# Patient Record
Sex: Male | Born: 1955 | Race: White | Marital: Married | Smoking: Current every day smoker
Health system: Northeastern US, Academic
[De-identification: ages and names within clinical notes are randomized; demographics above are authoritative.]

## PROBLEM LIST (undated history)

## (undated) DIAGNOSIS — I10 Essential (primary) hypertension: Secondary | ICD-10-CM

## (undated) DIAGNOSIS — F419 Anxiety disorder, unspecified: Secondary | ICD-10-CM

## (undated) DIAGNOSIS — K219 Gastro-esophageal reflux disease without esophagitis: Secondary | ICD-10-CM

## (undated) HISTORY — DX: Essential (primary) hypertension: I10

## (undated) HISTORY — DX: Anxiety disorder, unspecified: F41.9

---

## 2007-04-03 DIAGNOSIS — K219 Gastro-esophageal reflux disease without esophagitis: Secondary | ICD-10-CM | POA: Insufficient documentation

## 2008-11-24 ENCOUNTER — Encounter: Payer: Self-pay | Admitting: Cardiology

## 2008-11-25 ENCOUNTER — Other Ambulatory Visit: Payer: Self-pay | Admitting: Cardiology

## 2008-12-24 DIAGNOSIS — J988 Other specified respiratory disorders: Secondary | ICD-10-CM | POA: Insufficient documentation

## 2009-03-15 ENCOUNTER — Ambulatory Visit: Payer: Self-pay | Admitting: Primary Care

## 2009-04-14 ENCOUNTER — Ambulatory Visit: Payer: Self-pay | Admitting: Orthopedic Surgery

## 2009-05-06 NOTE — Progress Notes (Signed)
Seen in a new patient visit with complaints of left knee pain. He is a  53 year old gentleman who injured his knee in late September 2010. He was  walking when he slipped. A piece of aluminum struck him in the area of the  medial tibial plateau. He recalls having had a superficial abrasion. He did  have ecchymosis and swelling. He is only partially improved. He does have  some increased swelling in that area when he is on his feet a lot. 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 He has not tried anything in particular for the  symptoms. He has no mechanical symptoms regarding the knee itself. He does  not have pain in the knee, but only in the area where the piece of aluminum  struck him. Currently he reports that his pain is a 0 to 1. He does not  have any redness or warmth. He has no history of previous injury on that  side.    MEDICATIONS:He is not on any regular medications for these symptoms.    PAST MEDICAL HISTORY:He reports that he is generally healthy. He reports no  regular medical problems and does not take any regular medications.    ALLERGIES:He is allergic to cephalexin which causes a rash.    PAST SURGICAL HISTORY:For surgeries, he has had ORIF of a right calcaneus  fracture and right hand fracture.    SOCIAL HISTORY:The patient's smokes less than 1/2 pack per day x20 years.  He drinks 12 to 24 drinks per week.    FAMILY HISTORY:Family history and review of systems, denied.    The new patient questionnaire is included in the electronic record. Please  refer to this for additional details.    PHYSICAL EXAMINATION:   Exam shows a healthy appearing pleasant gentleman  who did not appear to be in acute distress. The left lower extremity is  inspected. There is slight soft tissue swelling in the area of the medial  tibial plateau. There are no areas of redness. There are no abrasions.  There is no warmth or induration. He was only tender  directly over that  area. The remainder of the knee and extremity is nontender to touch. He has  full range of motion to the knee. His knee is stable with stressing.  Stressing the MCL did not produce any laxity or discomfort. McMurray's is  negative for pain or a snap. He has good alignment of the limp and good  quadriceps strength is present. Close inspection of the area of swelling  reveals that there is a superficial vein directly in the central portion of  this which was only slightly tender to touch.    RADIOGRAPHS:  Plain radiographs were made and show minimal patellofemoral  DJD but there is no evidence of fracture or other pathology in that area.    IMPRESSION:  The patient sustained a hematoma from his injury in late  September. It is possible that he may have a small area of superficial  phlebitis overlying this.    RECOMMENDATION:  Heat and NSAIDs. He does not appear to have any pathology  within the knee itself. He is advised that these can sometimes take quite  some time to resolve, particularly when we are dealing with bone contusion  and hematoma. After discussing the problem in detail with him he had no  additional questions. He will follow up with me in a few weeks if this  does  not seem to be resolving.      Dictated by:  Edman Circle, PA  Electronically Reviewed and Signed by  Edman Circle, PA 04/18/2009 08:00  Co-signature.    Electronically Signed and Finalized  by  Wilder Glade, MD 05/06/2009 11:25  ___________________________________________  Wilder Glade, MD      DD:   04/14/2009  DT:   04/14/2009  1:42 P  AVW/UJ#[id number]  [id number]    cc:   [name], DO

## 2009-05-14 HISTORY — PX: APPENDECTOMY: SHX54

## 2009-07-22 ENCOUNTER — Encounter: Payer: Self-pay | Admitting: Gastroenterology

## 2009-10-04 ENCOUNTER — Observation Stay
Admission: EM | Admit: 2009-10-04 | Disposition: A | Discharge status: Home / Self Care | Payer: Self-pay | Source: Ambulatory Visit | Attending: Emergency Medicine | Admitting: Emergency Medicine

## 2009-10-04 ENCOUNTER — Encounter: Payer: Self-pay | Admitting: Emergency Medicine

## 2009-10-04 ENCOUNTER — Other Ambulatory Visit: Payer: Self-pay | Admitting: Gastroenterology

## 2009-10-04 HISTORY — DX: Gastro-esophageal reflux disease without esophagitis: K21.9

## 2009-10-04 LAB — CBC AND DIFFERENTIAL
Baso # K/uL: 0.1 THOU/uL (ref 0.0–0.1)
Basophil %: 0.7 % (ref 0.2–1.2)
Eos # K/uL: 0.3 THOU/uL (ref 0.0–0.5)
Eosinophil %: 4.9 % (ref 0.8–7.0)
Hematocrit: 44 % (ref 40–51)
Hemoglobin: 15.4 g/dL (ref 13.7–17.5)
Lymph # K/uL: 2 THOU/uL (ref 1.3–3.6)
Lymphocyte %: 29.4 % (ref 21.8–53.1)
MCV: 90 fL (ref 79–92)
Mono # K/uL: 0.7 THOU/uL (ref 0.3–0.8)
Monocyte %: 10.4 % (ref 5.3–12.2)
Neut # K/uL: 3.7 THOU/uL (ref 1.8–5.4)
Platelets: 216 THOU/uL (ref 150–330)
RBC: 4.9 MIL/uL (ref 4.6–6.1)
RDW: 13.3 % (ref 11.6–14.4)
Seg Neut %: 54.6 % (ref 34.0–67.9)
WBC: 6.7 THOU/uL (ref 4.2–9.1)

## 2009-10-04 LAB — TSH: TSH: 0.65 u[IU]/mL (ref 0.27–4.20)

## 2009-10-04 LAB — APTT: aPTT: 27 s (ref 22.3–35.3)

## 2009-10-04 LAB — PLASMA PROF 7 (ED ONLY)
Anion Gap,PL: 10 (ref 7–16)
CO2,Plasma: 25 mmol/L (ref 20–28)
Chloride,Plasma: 103 mmol/L (ref 96–108)
Creatinine: 0.84 mg/dL (ref 0.67–1.17)
GFR,Black: >59 *
GFR,Caucasian: >59 *
Glucose,Plasma: 104 mg/dL (ref 74–106)
Potassium,Plasma: 4 mmol/L (ref 3.4–4.7)
Sodium,Plasma: 138 mmol/L (ref 132–146)
UN,Plasma: 12 mg/dL (ref 6–20)

## 2009-10-04 LAB — TROPONIN T
Troponin T: <0.01 ng/mL (ref 0.00–0.02)
Troponin T: <0.01 ng/mL (ref 0.00–0.02)

## 2009-10-04 LAB — CALCIUM: Calcium: 9.1 mg/dL (ref 8.6–10.2)

## 2009-10-04 LAB — PROTIME-INR
INR: 1 (ref 0.9–1.1)
Protime: 13.5 s (ref 11.9–14.7)

## 2009-10-04 LAB — T4, FREE: Free T4: 1.1 ng/dL (ref 0.9–1.7)

## 2009-10-04 LAB — PHOSPHORUS: Phosphorus: 2.9 mg/dL (ref 2.7–4.5)

## 2009-10-04 LAB — MAGNESIUM: Magnesium: 1.7 meq/L (ref 1.3–2.1)

## 2009-10-04 MED ORDER — FAMOTIDINE 20 MG PO TABS *I*
20.0000 mg | ORAL_TABLET | Freq: Once | ORAL | Status: AC
Start: 2009-10-04 — End: 2009-10-04
  Administered 2009-10-04: 20 mg via ORAL
  Filled 2009-10-04: qty 1

## 2009-10-04 MED ORDER — ASPIRIN 81 MG PO CHEW *I*
324.0000 mg | CHEWABLE_TABLET | Freq: Once | ORAL | Status: AC
Start: 2009-10-04 — End: 2009-10-04
  Administered 2009-10-04: 324 mg via ORAL
  Filled 2009-10-04: qty 4

## 2009-10-04 MED ORDER — FAMOTIDINE 20 MG PO TABS *I*
20.0000 mg | ORAL_TABLET | Freq: Two times a day (BID) | ORAL | Status: DC
Start: 2009-10-05 — End: 2009-10-05
  Administered 2009-10-05: 20 mg via ORAL
  Filled 2009-10-04: qty 1

## 2009-10-04 MED ORDER — PANTOPRAZOLE SODIUM 40 MG PO TBEC *I*
40.0000 mg | DELAYED_RELEASE_TABLET | Freq: Every morning | ORAL | Status: DC
Start: 2009-10-05 — End: 2009-10-05
  Administered 2009-10-05: 40 mg via ORAL
  Filled 2009-10-04: qty 1

## 2009-10-04 MED ORDER — ALUM & MAG HYDROXIDE-SIMETH 200-200-20 MG/5ML PO SUSP *I*
30.0000 mL | Freq: Once | ORAL | Status: AC
Start: 2009-10-04 — End: 2009-10-04
  Administered 2009-10-04: 30 mL via ORAL

## 2009-10-04 MED ORDER — SODIUM CHLORIDE 0.9 % IV BOLUS *I*
1000.0000 mL | Status: AC
Start: 2009-10-04 — End: 2009-10-04
  Administered 2009-10-04: 1000 mL via INTRAVENOUS

## 2009-10-04 MED ORDER — NITROGLYCERIN 0.4 MG SL SUBL *I*
0.4000 mg | SUBLINGUAL_TABLET | SUBLINGUAL | Status: AC | PRN
Start: 2009-10-04 — End: 2009-10-04
  Administered 2009-10-04: 0.4 mg via SUBLINGUAL
  Filled 2009-10-04: qty 1

## 2009-10-04 MED ORDER — ACETAMINOPHEN 325 MG PO TABS *I*
650.0000 mg | ORAL_TABLET | ORAL | Status: DC | PRN
Start: 2009-10-04 — End: 2009-10-05
  Filled 2009-10-04: qty 1

## 2009-10-04 NOTE — ED Notes (Signed)
Patient denies pain and is resting comfortably. After walking to br in ed obs unit. Will monitor, observe,. Treat.repeat  ekg trop as per orders

## 2009-10-04 NOTE — ED Notes (Signed)
Pt not feeling well with n/v  Feeling of doom

## 2009-10-04 NOTE — ED Provider Notes (Addendum)
History   Chief Complaint   Patient presents with   . Generalized Body Aches       HPI Comments: 59 yom with sudden onset substernal heaviness, dyspnea, tingling in both hands and lightheaded.  The symptoms nearly resolve prior to arrival.  He's had similar symptoms 5 times in the past year.  He had a negative stress test 6 months ago.  No family history of heart disease.  No leg pain or swelling.  No recent travel.  No fevers or cough.    The history is provided by the patient.       Past Medical History   Diagnosis Date   . GERD (gastroesophageal reflux disease)          History reviewed.  No pertinent past surgical history.    History reviewed.  No pertinent family history.     reports that he has been smoking.  He does not have any smokeless tobacco history on file.  He reports that he does not currently drink alcohol.    Review of Systems   Review of Systems   Constitutional: Negative.    HENT: Negative.    Eyes: Negative.    Respiratory: Negative.    Cardiovascular: Negative.    Gastrointestinal: Negative.    Genitourinary: Negative.    Musculoskeletal: Negative.    Skin: Negative.    Neurological: Negative.    Hematological: Negative.    Psychiatric/Behavioral: Negative.        Physical Exam   BP 139/94  Pulse 93  Temp 36.8 C (98.2 F)  Resp 17  SpO2 97%    Physical Exam   Constitutional: He is oriented to person, place, and time. He appears well-developed and well-nourished.   HENT:   Head: Normocephalic and atraumatic.   Mouth/Throat: Oropharynx is clear and moist.   Eyes: Conjunctivae are normal. Pupils are equal, round, and reactive to light.   Neck: Normal range of motion.   Cardiovascular: Normal rate, regular rhythm, normal heart sounds and intact distal pulses.    Pulmonary/Chest: Effort normal and breath sounds normal.   Abdominal: Soft. Bowel sounds are normal.   Musculoskeletal: Normal range of motion.   Neurological: He is alert and oriented to person, place, and time.   Skin: Skin is warm  and dry.   Psychiatric: He has a normal mood and affect. His behavior is normal. Judgment and thought content normal.       Medical Decision Making   MDM  Number of Diagnoses or Management Options  Diagnosis management comments: Patient seen by me today, 10/04/2009 at 1330    Assessment:  54 y.o., male comes to the ED with chest pain  Differential Diagnosis includes acs, gastritis/gerd, ptx  Plan: asa, nitro, pepcid, maalox, cbc, chem7, trop, cxr                Loma Messing, MD  Patient seen by me today, 10/04/2009 at 1350    History:   I reviewed this patient, reviewed the resident note and agree  Exam:   I examined this patient, reviewed the resident note and agree    Decision Making:   I discussed with the documented resident decision making  and agree    Pt seen and examined.  H&P reviewed with resident.  Pt presents with sternal CP, dyspnea, arm tingling.  Has had several episodes of this and has had several negative stress tests per Pt.  Pt says H/O GERD but he is not sure if  this is related to that or not.  Feels better now.  +TOB use    Anxious on exam.  Chest: CTA bilaterally, no wheeze or crackles  CV: RRR , nl s1 and s2, no murmor, equal radial pulse, nl cap refil    Pt with recurrent episodes of CP pain.  Has not had a cardiac cath.  Has not had GI workup.  Concern for ACS, pneumonic precess, pneumo, gi causes, angina.  Labs, CXR, reassess, admit to obs          Author Levora Angel, MD    Levora Angel, MD  10/04/09 [phone]

## 2009-10-04 NOTE — ED Notes (Signed)
Pt into ed c/o chest heaviness with upper extremity tingling lower extremity "gurgling" little nausea over past couple hours. 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

## 2009-10-04 NOTE — ED Obs Notes (Signed)
History   Chief Complaint   Patient presents with   . Generalized Body Aches       HPI Comments: Mr. Bartz is a 54 y/o male with PMHx of GERD placed in OBS after evaluation in the ED for chest pain. Pt states he had chest pressure this afternoon while in a store that was severe, radiated to his left shoulder, associated with nausea, shortness of breath, and bilateral hand numbness. Pt denies diaphoresis, abdominal pain, vomiting, diarrhea, dysuria, headache. Pt states he has had 5 similar symptoms to this since last august. Pt has had 5 previous rule outs this year all negative, and diagnosed with GERD. Pt also had a negative stress echo three months ago. Pt reports the symptoms are alleviated with belching. Pt reports felling bloated over the past few weeks. No significant family cardiac history. No recent travel. PCP is Dr. [name]. Pt reports pain has dissipated, now with chest discomfort.    Patient is a 54 y.o. male presenting with chest pain. The history is provided by the patient. No language interpreter was used.   Chest Pain  The chest pain began 6 - 12 hours ago. Chest pain occurs constantly. The chest pain is worsening. At its most intense, the pain is at 5/10. The pain is currently at 1/10. The severity of the pain is severe. The quality of the pain is described as pressure-like. The pain radiates to the left shoulder. Additional symptoms include shortness of breath and nausea. Additional symptoms include no fever, no fatigue, no cough, no wheezing, no palpitations, no abdominal pain, no vomiting and no dizziness.   Additional symptoms include numbness. Additional symptoms do not include diaphoresis or weakness. He tried nothing for the symptoms.   Risk factors for ischemic cardiac disease include being male. Past medical history is negative for hypertension, diabetes, deep vein thrombosis, pulmonary embolism, cancer, past myocardial infarction, congestive heart failure, chronic obstructive  pulmonary disease or Marfan's syndrome. Procedure history is positive for stress echo and exercise treadmill test.       Past Medical History   Diagnosis Date   . GERD (gastroesophageal reflux disease)          No past surgical history on file.    No family history on file.     reports that he has been smoking cigarettes.  He does not have any smokeless tobacco history on file.  He reports that he does not currently drink alcohol or use illicit drugs.    Review of Systems   Review of Systems   Constitutional: Negative for fever, chills, diaphoresis and fatigue.   HENT: Positive for neck pain and neck stiffness. Negative for sore throat and trouble swallowing.    Eyes: Negative for photophobia, pain and visual disturbance.   Respiratory: Positive for shortness of breath. Negative for cough, chest tightness and wheezing.    Cardiovascular: Positive for chest pain. Negative for palpitations and leg swelling.   Gastrointestinal: Positive for nausea. Negative for vomiting, abdominal pain, diarrhea and blood in stool.   Genitourinary: Negative for dysuria and hematuria.   Musculoskeletal: Negative for gait problem.   Skin: Negative for color change and wound.   Neurological: Positive for light-headedness and numbness. Negative for dizziness, syncope, weakness and headaches.   Psychiatric/Behavioral: Negative for behavioral problems, confusion, decreased concentration and agitation.       Physical Exam   BP 147/92  Pulse 84  Temp(Src) 36.9 C (98.4 F) (Tympanic)  Resp 20  SpO2 98%  Physical Exam   Constitutional: He is oriented to person, place, and time. He appears well-developed and well-nourished. No distress.   HENT:   Head: Normocephalic and atraumatic.   Right Ear: External ear normal.   Left Ear: External ear normal.   Eyes: Conjunctivae and EOM are normal. Pupils are equal, round, and reactive to light. Right eye exhibits no discharge. Left eye exhibits no discharge. No scleral icterus.   Neck: Normal range  of motion. Neck supple. No JVD present. No tracheal deviation present.   Cardiovascular: Normal rate, regular rhythm, normal heart sounds and intact distal pulses.    Pulmonary/Chest: Effort normal and breath sounds normal. No stridor. No respiratory distress. He has no wheezes. He has no rales.   Abdominal: Soft. Bowel sounds are normal. He exhibits no distension. No tenderness. He has no rebound and no guarding.   Musculoskeletal: Normal range of motion. He exhibits no edema and no tenderness.   Lymphadenopathy:     He has no cervical adenopathy.   Neurological: He is alert and oriented to person, place, and time. No cranial nerve deficit. He exhibits normal muscle tone. Coordination normal.   Skin: Skin is warm and dry. He is not diaphoretic. No erythema.   Psychiatric: He has a normal mood and affect. His behavior is normal.     Recent Results (from the past 24 hour(s))   PROTIME-INR    Collection Time    10/04/09 12:10 PM   Component Value Range   . Protime 13.5  11.9-14.7 (sec)   . INR 1.0  0.9-1.1    APTT    Collection Time    10/04/09 12:10 PM   Component Value Range   . aPTT 27.0  22.3-35.3 (sec)   PLASMA PROF 7 [facility] ED ONLY)    Collection Time    10/04/09 12:10 PM   Component Value Range   . CHLORIDE,PLASMA 103  96-108 (mmol/L)   . CO2,PLASMA 25  20-28 (mmol/L)   . POTASSIUM,PLASMA 4.0  3.4-4.7 (mmol/L)   . SODIUM,PLASMA 138  132-146 (mmol/L)   . ANION GAP 10  7-16    . UREA NITROGEN,PLASMA 12  6-20 (mg/dL)   . CREATININE,PLASMA 0.84  0.67-1.17 (mg/dL)   . GFR,CAUCASIAN > 59  (*)   . GFR,BLACK > 59  (*)   . Glucose, Plasma 104  74-106 (mg/dL)   CALCIUM    Collection Time    10/04/09 12:10 PM   Component Value Range   . Calcium 9.1  8.6-10.2 (mg/dL)   MAGNESIUM    Collection Time    10/04/09 12:10 PM   Component Value Range   . Magnesium 1.7  1.3-2.1 (mEq/L)   PHOSPHORUS    Collection Time    10/04/09 12:10 PM   Component Value Range   . Phosphorus 2.9  2.7-4.5 (mg/dL)   TSH    Collection Time    10/04/09 12:10 PM    Component Value Range   . TSH 0.65  0.27-4.20 (uIU/mL)   T4, FREE    Collection Time    10/04/09 12:10 PM   Component Value Range   . Free T4 1.1  0.9-1.7 (ng/dL)   TROPONIN T    Collection Time    10/04/09 12:10 PM   Component Value Range   . Troponin T < 0.01  0.00-0.02 (ng/mL)   CBC AND DIFFERENTIAL    Collection Time    10/04/09 12:10 PM   Component Value Range   . WBC 6.7  4.2-9.1 (THOU/uL)   . RBC 4.9  4.6-6.1 (MIL/uL)   . Hemoglobin 15.4  13.7-17.5 (g/dL)   . Hematocrit 44  40-51 (%)   . MCV 90  79-92 (fL)   . RDW 13.3  11.6-14.4 (%)   . Platelets 216  150-330 (THOU/uL)   . Seg Neut % 54.6  34.0-67.9 (%)   . Lymphocyte % 29.4  21.8-53.1 (%)   . Monocyte % 10.4  5.3-12.2 (%)   . Eosinophil % 4.9  0.8-7.0 (%)   . Basophil % 0.7  0.2-1.2 (%)   . Neut # K/uL 3.7  1.8-5.4 (THOU/uL)   . Lymph # K/uL 2.0  1.3-3.6 (THOU/uL)   . Mono # K/uL 0.7  0.3-0.8 (THOU/uL)   . Eos # K/uL 0.3  0.0-0.5 (THOU/uL)   . Baso # K/uL 0.1  0.0-0.1 (THOU/uL)   TROPONIN T    Collection Time    10/04/09  6:49 PM   Component Value Range   . Troponin T < 0.01  0.00-0.02 (ng/mL)         Chest Frontal & Lat    10/04/2009  IMPRESSION: No acute cardiopulmonary disease.       Medical Decision Making   MDM  Number of Diagnoses or Management Options  Chest pain:   Diagnosis management comments: Patient seen by me on 10/04/2009 at 6:52 PM.    Assessment:  54 y.o., male with PMHx GERD placed in OBS after evaluation in the ED for chest pain described as pressure that radiated to his left shoulder, with assocaitted bilateral hand numbness, nausea, SOB, lightheadedness.   Differential Diagnosis includes ACS, GERD, anxiety, musculoskeletal.   Plan: serial trops (first negative), EKG, protonix 40 mg PO, AM labs, out pt echo, vitals, tele monitoring, cardiac diet, DC in AM pending trops, PCP visit possible GI workup for GERD/PU.              Gabriel Cirri, PA

## 2009-10-05 ENCOUNTER — Ambulatory Visit: Payer: Self-pay | Admitting: Primary Care

## 2009-10-05 LAB — CBC AND DIFFERENTIAL
Baso # K/uL: 0 THOU/uL (ref 0.0–0.1)
Basophil %: 0.5 % (ref 0.2–1.2)
Eos # K/uL: 0.4 THOU/uL (ref 0.0–0.5)
Eosinophil %: 5.3 % (ref 0.8–7.0)
Hematocrit: 41 % (ref 40–51)
Hemoglobin: 13.7 g/dL (ref 13.7–17.5)
Lymph # K/uL: 3.2 THOU/uL (ref 1.3–3.6)
Lymphocyte %: 41.9 % (ref 21.8–53.1)
MCV: 91 fL (ref 79–92)
Mono # K/uL: 1 THOU/uL — ABNORMAL HIGH (ref 0.3–0.8)
Monocyte %: 13.3 % — ABNORMAL HIGH (ref 5.3–12.2)
Neut # K/uL: 3 THOU/uL (ref 1.8–5.4)
Platelets: 208 THOU/uL (ref 150–330)
RBC: 4.5 MIL/uL — ABNORMAL LOW (ref 4.6–6.1)
RDW: 13.2 % (ref 11.6–14.4)
Seg Neut %: 39 % (ref 34.0–67.9)
WBC: 7.7 THOU/uL (ref 4.2–9.1)

## 2009-10-05 LAB — BASIC METABOLIC PANEL
Anion Gap: 8 (ref 7–16)
CO2: 26 mmol/L (ref 20–28)
Calcium: 8.4 mg/dL — ABNORMAL LOW (ref 8.6–10.2)
Chloride: 102 mmol/L (ref 96–108)
Creatinine: 1.03 mg/dL (ref 0.67–1.17)
GFR,Black: >59 *
GFR,Caucasian: >59 *
Glucose: 90 mg/dL (ref 74–106)
Lab: 12 mg/dL (ref 6–20)
Potassium: 4 mmol/L (ref 3.3–5.1)
Sodium: 136 mmol/L (ref 133–145)

## 2009-10-05 LAB — TROPONIN T: Troponin T: <0.01 ng/mL (ref 0.00–0.02)

## 2009-10-05 NOTE — Progress Notes (Signed)
Utilization Management    Level of Care Observation service as of the date 10/04/09      Sims Laday A Gaila Engebretsen, RN     Pager: [phone]

## 2009-10-05 NOTE — ED Notes (Signed)
Pt resting comfortably. Denies current CP, SOB, N/V and dizziness. Pt agreeable to plan; telemetry monitoring, pain/symptom management. Will continue to monitor.

## 2009-10-05 NOTE — ED Obs Notes (Signed)
Patient seen by me today, 10/05/2009 at 9:13 AM    Chief Complaint: Chief Complaint   Patient presents with   . Generalized Body Aches       Subjective:  Called by nurse, patient is threatening to leave, he wants to go to work.     Nursing Pain Score:  Last Nursing documented pain:  0-10 Scale: 0 (10/05/09 0600)      Vitals: Patient Vitals in the past 24 hrs:   BP Temp Temp src Pulse Resp SpO2   10/05/09 0600 128/90 mmHg 35.8 C (96.4 F) TEMPORAL 70  20  97 %   10/05/09 0108 120/83 mmHg 36 C (96.8 F) TEMPORAL 60  18  98 %   10/04/09 2200 135/95 mmHg 37.2 C (99 F) TEMPORAL 69  18  98 %   10/04/09 1758 147/92 mmHg 36.9 C (98.4 F) Tympanic 84  20  98 %   10/04/09 1500 139/94 mmHg - - 93  17  97 %   10/04/09 1111 179/121 mmHg 36.8 C (98.2 F) - 111  18  99 %         Physical Examination:  Physical Exam  Alert, oriented X 3 in NAD  CTA  RRR  Soft NT ND  No calf tenderness    Lab Results: All labs in the last 72 hours   Recent Results (from the past 72 hour(s))   PROTIME-INR    Collection Time    10/04/09 12:10 PM   Component Value Range   . Protime 13.5  11.9-14.7 (sec)   . INR 1.0  0.9-1.1    APTT    Collection Time    10/04/09 12:10 PM   Component Value Range   . aPTT 27.0  22.3-35.3 (sec)   PLASMA PROF 7 [facility] ED ONLY)    Collection Time    10/04/09 12:10 PM   Component Value Range   . CHLORIDE,PLASMA 103  96-108 (mmol/L)   . CO2,PLASMA 25  20-28 (mmol/L)   . POTASSIUM,PLASMA 4.0  3.4-4.7 (mmol/L)   . SODIUM,PLASMA 138  132-146 (mmol/L)   . ANION GAP 10  7-16    . UREA NITROGEN,PLASMA 12  6-20 (mg/dL)   . CREATININE,PLASMA 0.84  0.67-1.17 (mg/dL)   . GFR,CAUCASIAN > 59  (*)   . GFR,BLACK > 59  (*)   . Glucose, Plasma 104  74-106 (mg/dL)   CALCIUM    Collection Time    10/04/09 12:10 PM   Component Value Range   . Calcium 9.1  8.6-10.2 (mg/dL)   MAGNESIUM    Collection Time    10/04/09 12:10 PM   Component Value Range   . Magnesium 1.7  1.3-2.1 (mEq/L)   PHOSPHORUS    Collection Time    10/04/09 12:10 PM   Component  Value Range   . Phosphorus 2.9  2.7-4.5 (mg/dL)   TSH    Collection Time    10/04/09 12:10 PM   Component Value Range   . TSH 0.65  0.27-4.20 (uIU/mL)   T4, FREE    Collection Time    10/04/09 12:10 PM   Component Value Range   . Free T4 1.1  0.9-1.7 (ng/dL)   TROPONIN T    Collection Time    10/04/09 12:10 PM   Component Value Range   . Troponin T < 0.01  0.00-0.02 (ng/mL)   CBC AND DIFFERENTIAL    Collection Time    10/04/09 12:10 PM   Component Value Range   .  WBC 6.7  4.2-9.1 (THOU/uL)   . RBC 4.9  4.6-6.1 (MIL/uL)   . Hemoglobin 15.4  13.7-17.5 (g/dL)   . Hematocrit 44  40-51 (%)   . MCV 90  79-92 (fL)   . RDW 13.3  11.6-14.4 (%)   . Platelets 216  150-330 (THOU/uL)   . Seg Neut % 54.6  34.0-67.9 (%)   . Lymphocyte % 29.4  21.8-53.1 (%)   . Monocyte % 10.4  5.3-12.2 (%)   . Eosinophil % 4.9  0.8-7.0 (%)   . Basophil % 0.7  0.2-1.2 (%)   . Neut # K/uL 3.7  1.8-5.4 (THOU/uL)   . Lymph # K/uL 2.0  1.3-3.6 (THOU/uL)   . Mono # K/uL 0.7  0.3-0.8 (THOU/uL)   . Eos # K/uL 0.3  0.0-0.5 (THOU/uL)   . Baso # K/uL 0.1  0.0-0.1 (THOU/uL)   TROPONIN T    Collection Time    10/04/09  6:49 PM   Component Value Range   . Troponin T < 0.01  0.00-0.02 (ng/mL)   CBC AND DIFFERENTIAL    Collection Time    10/05/09 12:02 AM   Component Value Range   . WBC 7.7  4.2-9.1 (THOU/uL)   . RBC 4.5 (*) 4.6-6.1 (MIL/uL)   . Hemoglobin 13.7  13.7-17.5 (g/dL)   . Hematocrit 41  40-51 (%)   . MCV 91  79-92 (fL)   . RDW 13.2  11.6-14.4 (%)   . Platelets 208  150-330 (THOU/uL)   . Seg Neut % 39.0  34.0-67.9 (%)   . Lymphocyte % 41.9  21.8-53.1 (%)   . Monocyte % 13.3 (*) 5.3-12.2 (%)   . Eosinophil % 5.3  0.8-7.0 (%)   . Basophil % 0.5  0.2-1.2 (%)   . Neut # K/uL 3.0  1.8-5.4 (THOU/uL)   . Lymph # K/uL 3.2  1.3-3.6 (THOU/uL)   . Mono # K/uL 1.0 (*) 0.3-0.8 (THOU/uL)   . Eos # K/uL 0.4  0.0-0.5 (THOU/uL)   . Baso # K/uL 0.0  0.0-0.1 (THOU/uL)   BASIC METABOLIC PANEL    Collection Time    10/05/09 12:02 AM   Component Value Range   . Glucose 90  74-106  (mg/dL)   . Sodium 136  133-145 (mmol/L)   . Potassium 4.0  3.3-5.1 (mmol/L)   . Chloride 102  96-108 (mmol/L)   . CO2 26  20-28 (mmol/L)   . Anion Gap 8  7-16    . BUN 12  6-20 (mg/dL)   . Creatinine 1.03  0.67-1.17 (mg/dL)   . GFR MDRD Non Af Amer > 59  (*)   . GFR MDRD Af Amer > 59  (*)   . Calcium 8.4 (*) 8.6-10.2 (mg/dL)   TROPONIN T    Collection Time    10/05/09 12:02 AM   Component Value Range   . Troponin T < 0.01  0.00-0.02 (ng/mL)             Imaging findings: Chest Frontal & Lat    10/04/2009  IMPRESSION: No acute cardiopulmonary disease.     Assessment: 54 years old male with history of GERD here with atypical chest pain. All troponins are negative. No arrhythmias on telemetry. No acute ECG changes. Most likely severe gastritis. This patient is currently taking PPI. He also continues to smoke.    Plan: Will discharge home in stable condition. This patient will see his PCP and ask for a referal to a gastroenterologist for possible  EGD  Medically preferred DVT prophylaxis: None    Author: Vanetta Shawl, MD,PHD  Note created: 10/05/2009  at: 9:13 AM

## 2009-10-05 NOTE — Progress Notes (Signed)
Reason For Visit   Follow up [facility] ED visit was admitted with heart attack sx DX with acid   reflux requesting to be referred to GI.  HPI   Patient is here for follow-up appointment.  Once again in the emergency   room with atypical chest pain was found not to be cardiac in this is about   the fifth time this has happened this year.  He does have known Genella Rife but   has never had scope and it is necessary at this point he has a stone.  Also   while he was in the hospital and prior to a hospital before he was having   numbness in both his hands and his left cheek.  This is about the time that   he was having trouble breathing.  It sounds like he is described in a panic   attack by he feel it is still some numbness left to his hands bilaterally.    No current chest pain no other complaints or problems.  Medications   reviewed.  Allergies   Cephalexin CAPS.  Current Meds   Ciprofloxacin HCl 500 MG Tablet;TAKE 1 TABLET EVERY 12 HOURS DAILY.; Rx  Valacyclovir HCl 1 GM Tablet;TAKE 1 TABLET 3 TIMES DAILY FOR 7 DAYS.; Rx  Pantoprazole Sodium 40 MG Tablet Delayed Release;TAKE 1 TABLET DAILY.; Rx  Ketek 400 MG Tablet;TAKE 2 TABLETS DAILY FOR 5 DAYS.; Rx.  Active Problems   Esophageal Reflux (530.81)  Reactive Airway Disease (493.90).  ROS   Patient denies trouble with eyes, ears, nose or throat  patient denies trouble with  leg swelling or a funny heartbeats  patient denies  stomachache, diarrhea, or constipation  patient denies sores,changing moles or itching  patient denies mood changes, sleeping problems, weight loss, night sweats  patient denies pain or swelling in joints or muscles, weakness, or headaches  patient denies taking any herbal supplements are natural remedies  patient denies any family members with new serious medical problems  patient denies any recent falls  patient denies any new medical allergies  patient denies smoking  .  Vital Signs   Recorded by Sprague,Courtney on 05 Oct 2009 01:25 PM  BP:144/86,   HR:  74 b/min,   Weight: 209.8 lb.  Physical Exam   Heart regular rate and rhythm no murmurs.  Lungs clear to auscultation bilaterally no wheezes rhonchi or rales.  Abdominal Exam: Bowel sounds x 4 quadrants, no palpable masses or   tenderness, rebound or rigidity.  No bruits heard or felt.  Assessment   Chronic Gerd, recurrent chest pain, paresthesias bilateral hands.  Plan   Refer for endoscopy, get a C-spine x-ray, will see him back in one to two   weeks for recheck or sooner if needed.  Consider nerve conduction study.    Continue current medications.  Signature   Electronically signed by: [name]  D.O.; 10/05/2009 1:57 PM EST.

## 2009-10-05 NOTE — Discharge Instructions (Signed)
Please follow up with your PCP this week if possible.

## 2009-10-06 LAB — EKG 12-LEAD
P: 3 degrees
P: 9 degrees
PR: 156 ms
PR: 160 ms
QRS: 60 degrees
QRS: 64 degrees
QRSD: 102 ms
QRSD: 96 ms
QT: 344 ms
QT: 432 ms
QTc: 428 ms
QTc: 437 ms
Rate: 59 {beats}/min
Rate: 97 {beats}/min
Severity: ABNORMAL
Severity: BORDERLINE
Statement: ABNORMAL
Statement: BORDERLINE
Statement: BORDERLINE
T: -16 degrees
T: 2 degrees

## 2009-11-04 LAB — HM COLONOSCOPY

## 2009-11-10 ENCOUNTER — Ambulatory Visit: Payer: Self-pay | Admitting: Internal Medicine

## 2009-11-10 NOTE — Progress Notes (Signed)
Reason For Visit   Follow up hospital visit r/t bee stings.  Allergies   Cephalexin CAPS.  Current Meds   Ciprofloxacin HCl 500 MG Tablet;TAKE 1 TABLET EVERY 12 HOURS DAILY.; Rx  Ketek 400 MG Tablet;TAKE 2 TABLETS DAILY FOR 5 DAYS.; Rx  Valacyclovir HCl 1 GM Tablet;TAKE 1 TABLET 3 TIMES DAILY FOR 7 DAYS.; Rx  Pantoprazole Sodium 40 MG Tablet Delayed Release;TAKE 1 TABLET DAILY.; Rx  Famotidine 40 MG Tablet;; RPT.  Active Problems   Esophageal Reflux (530.81)  Reactive Airway Disease (493.90).  Vital Signs   Recorded by Martorana,Christina on 10 Nov 2009 01:06 PM  BP:124/70,   HR: 72 b/min,   Weight: 207.4 lb.  SOAP                SUBJECTIVE: Dustin Conley is a 54 year old male who is here for follow up of a   recent emergency room visit in [facility] last weekend when he got   stung by about 4 beats.  He has a severe bee allergy.  He did not have his   EpiPen with him but thankfully they did have Benadryl in the first aid kit   and he took Benadryl and went directly to the emergency room via 911   ambulance transport. he was treated initially in the emergency department   but actually remained admitted to the hospital for two days because of a   severe prolonged reaction.  They discharged him on oral prednisone,   Benadryl, and Xanax for anxiety attacks and they wanted him to follow up   with [place].  He does have a new prescription for an EpiPen now.  This was not   his first reaction.     He feels well now he states.  No shortness of breath or glossal edema or   throat tightness.  He still has some swelling around his eyes in the   morning but it's not nearly as bad as it was.     he also states that the doctors in the emergency department thought he was   suffering from anxiety and he states that Dr. Marina Goodell has been telling him   the same thing and he is now admitting that perhaps it is anxiety.  They   gave him some Xanax on discharge but he would like a little bit more   because he is feeling pretty anxious on the  prednisone and after this   episode.     OBJECTIVE: NAD    heart NSR without murmur, rub or  gallop   no carotid bruits    no pedal   edema  lungs CTA bilaterally without wheeze, rales or rhonchi  good chest    expansion is noted today           ASSESSMENT: anaphylactic reaction to bee stings.     PLAN: continue his prednisone, Benadryl p.r.n., Xanax p.r.n., and Prevacid   as currently prescribed.   He is to call immediately if he has any   increasing problems.  I did ask that he make a follow-up appointment with   doctor Marina Goodell or myself in the next few weeks after he is off all   medications, to discuss different medications to control his anxiety.        Gretta Arab RPA-C  Electronically signed and dictated using Chief Financial Officer     [facility]  [address]  [place] [address]     .  Orders   ALPRAZolam 0.25 MG Tablet;TAKE 1 TABLET TWICE DAILY AS NEEDED; Qty15; R0;   Rx.  Signature   Electronically signed by: Gretta Arab  PA-C; 11/10/2009 2:06 PM EST.

## 2009-12-21 ENCOUNTER — Ambulatory Visit: Payer: Self-pay | Admitting: Primary Care

## 2009-12-21 NOTE — Progress Notes (Signed)
Reason For Visit   Follow up bee stings and allergic reaction.  HPI   Patient is here for follow-up he is had anaphylactic reaction severe   allergies to bee stings he spent in the hospital multiple times for   conditions it just as it resolving he prednisone and still on prednisone   right now.  Needs refill is EpiPen.  No other new complaints or problems.    Medications reviewed.  Allergies   Cephalexin CAPS.  Current Meds   Ciprofloxacin HCl 500 MG Tablet;TAKE 1 TABLET EVERY 12 HOURS DAILY.; Rx  Ketek 400 MG Tablet;TAKE 2 TABLETS DAILY FOR 5 DAYS.; Rx  Valacyclovir HCl 1 GM Tablet;TAKE 1 TABLET 3 TIMES DAILY FOR 7 DAYS.; Rx  Pantoprazole Sodium 40 MG Tablet Delayed Release;TAKE 1 TABLET DAILY.; Rx  Famotidine 40 MG Tablet;; RPT  ALPRAZolam 0.25 MG Tablet;TAKE 1 TABLET TWICE DAILY AS NEEDED.; Rx  EpiPen 2-Pak 0.3 MG/0.3ML Device;; RPT  PredniSONE 10 MG Tablet;; RPT  Lansoprazole 30 MG Capsule Delayed Release;; RPT.  Active Problems   Esophageal Reflux (530.81)  Reactive Airway Disease (493.90).  ROS   Patient denies trouble with eyes, ears, nose or throat  patient denies trouble with breathing, chest pain, leg swelling or a funny   heartbeats  patient denies heartburn, stomachache, diarrhea, or constipation  patient denies taking any herbal supplements are natural remedies  patient denies any family members with new serious medical problems  patient denies any recent falls  patient denies any new medical allergies  patient denies smoking  .  Vital Signs   Recorded by Sprague,Courtney on 21 Dec 2009 02:28 PM  BP:148/104,   HR: 101 b/min,   Weight: 203.8 lb,   O2 Sat: 96 (%SpO2).  Physical Exam   Heart regular rate and rhythm no murmurs.  Lungs clear to auscultation bilaterally no wheezes rhonchi or rales.  No hives noted.  Assessment   Severe bee sting allergy with anaphylactic reaction and anxiety secondary   to above problems.  Orders   Renew EpiPen 2-Pak 0.3 MG/0.3ML Device;INJECT 0.3 ML INTRAMUSCULARLY AS    DIRECTED; Qty2; R3; Rx.  Plan   Warnings given, medications as prescribed, referred to an allergist for   evaluation treatment, warnings given.  Continue current medications.  Signature   Electronically signed by: [name]  D.O.; 12/21/2009 4:32 PM EST.

## 2010-02-28 ENCOUNTER — Ambulatory Visit: Payer: Self-pay | Admitting: Internal Medicine

## 2010-02-28 NOTE — Progress Notes (Signed)
 Message   Patient has been having some stomach issues since Thursday, he had some   [place] on Thursday that he do sent think was cooked and wedding food on   Saturday and it really started, he is having bloating, sharp cramping and   lower back pain, some diarrhea and some constipation and naueated.               SUBJECTIVE: Dustin Conley is a 54 year old male who presents today with a 4-day   complaint of increasing abdominal bloating and tenderness, he has cramping   and he feels a lot of pressure in his lower abdomen, a couple of days ago   he had some diarrhea but there was no blood in it, and that resolved.  He   does feel a little constipated but that is usually not a problem for him.    He rates his pain as a 9 on the pain scale, he admits to feeling some   rectal pressure, and when he tries to urinate he feels pressure and some   discomfort with urination but he doesn't classify as exactly burning. he   has the pain radiating into his lower back and he states he is just very   uncomfortable. He is anorexic, he did eat a granola bar today stayed down   and he doesn't really have nausea but he is so comfortable he does not feel   like eating.     OBJECTIVE: patient appears uncomfortable    abdominal exam reveals hypoactive bowel sounds today, he has acute   tenderness to palpation in the right lower quadrant, and then has   generalized bloating with discomfort throughout the abdomen.  No   appreciable organomegaly or masses.  I did not do a rectal exam today to check the prostate, to avoid causing   further problems     ASSESSMENT: acute abdominal pain with bloating and some urinary symptoms   that are suspicious for possible acute bacterial prostatitis.   His   abdominal exam however is also suspicious for possibleacute appendicitis,   or  obstruction, also consider diverticulitis     PLAN: considering the hour of the day and access to our testing, he is   [facility], I have called the  head and let   them know of his arrival and my suspicions.  He will need blood work, CT   scan, and pain management. Patient is in agreement and he will transfer   himself.        Gretta Arab RPA-C  Electronically signed and dictated using Chief Financial Officer     [facility]  [address]  [place] [address]     .  Current Meds   EpiPen 2-Pak 0.3 MG/0.3ML Device;INJECT 0.3 ML INTRAMUSCULARLY AS DIRECTED;   Rx  Valacyclovir HCl 1 GM Tablet;TAKE 1 TABLET 3 TIMES DAILY FOR 7 DAYS.; Rx  Lansoprazole 30 MG Capsule Delayed Release;; RPT  PredniSONE 10 MG Tablet;; RPT  Famotidine 40 MG Tablet;; RPT  ALPRAZolam 0.25 MG Tablet;TAKE 1 TABLET TWICE DAILY AS NEEDED.; Rx  Pantoprazole Sodium 40 MG Tablet Delayed Release;TAKE 1 TABLET DAILY.; Rx  Ciprofloxacin HCl 500 MG Tablet;TAKE 1 TABLET EVERY 12 HOURS DAILY.; Rx  Ketek 400 MG Tablet;TAKE 2 TABLETS DAILY FOR 5 DAYS.; Rx.  Active Problems   Esophageal Reflux (530.81)  Reactive Airway Disease (493.90).  Vital Signs   Recorded by [facility] on 28 Feb 2010  03:53 PM  BP:140/80,   HR: 98 b/min,   Height: 67 in, Weight: 204.6 lb, BMI: 32 kg/m2,   O2 Sat: 98 (%SpO2).  Signature   Electronically signed by: Gretta Arab  PA-C; 02/28/2010 4:16 PM EST.

## 2010-03-15 ENCOUNTER — Ambulatory Visit: Payer: Self-pay | Admitting: Internal Medicine

## 2010-03-15 DIAGNOSIS — F411 Generalized anxiety disorder: Secondary | ICD-10-CM | POA: Insufficient documentation

## 2010-03-15 NOTE — Progress Notes (Signed)
 Reason For Visit   Patient is here for follow up from hospital visit.               SUBJECTIVE: Dustin Conley is a 54 year old male who is here for his follow-up from   an emergency hospital visit, secondary to a nearly ruptured appendix.  He   was admitted had emergency surgery and stayed in the hospital for 12 days   on Cipro and Flagyl and pain management.  They discharged him on oral Cipro   and Flagyl and he continues to take that until he completes his two-week   treatment course.  He has  pretty much stopped the Vicodin for pain   management at this point and just using Advil as needed.His surgical   incisions are doing well and do not seem to be draining or bothering him.   His appetite is slowly coming back and he is still mostly eating a diet of   soft foods and bland foods.  He lost 14 pounds well he was admitted to  at   [facility] having the surgery and recovery.     He has another coexisting condition of generalized anxiety disorder that    we had temporarily been treating with alprazolam as needed, but since this   has happened he has had some increased and ongoing problems with   generalized anxiety and he thinks this time that he trya regular medication   for that.  He denies any suicidal or homicidal ideations, but has trouble   sleeping and in general has continued problems with anxiety.     He states that he has had chronic health issues over the last year that   have just left him with a feeling of general unwellness, he had a problem   with a very bad acute allergic reaction to bee Sting last year in the   antrum next, and he has had a conglomerate of chronic issues such as   abdominal discomfort and bloating, anxiety, hives, etc..  Four which he is   seeing Dr. Hulan Amato currently. He is actually screening him for a rare   condition called mastocytosis,,  which could causeall of the symptoms that   he has been experiencing over the last year or so.  His blood work that we   have done over the last year  or so have been normal.     No other complaints.     OBJECTIVE: NAD          vitals are stable     Abdominal exam reveals 3 small laparoscopic incisions with staples   closures.  Minimal erythema surrounding these areas without any purulent   drainage.  He has normal active bowel sounds appreciated, and generalized   soreness at this point throughout the abdomen but nothing acute.         ASSESSMENT: 1.  Status post emergency appendectomy with good results in   good recovery thus far.                          2.  generalized anxiety disorder     PLAN:              1. I have asked him to call his surgeon's office and be   sure that they gave him the right amount of oral antibiotics to complete   his full 14 day course.  He states that he will be running  out of his   medications in one days.                         2. I am starting him on Zoloft 50 mg once daily,   counseled on side effects and he is to call us right away if he has any   problems with these that her out of the ordinary or any increased problems   with depression or dark soft.  I will see him in follow-up in another two   weeks to see how he is doing on that dose and likely increase it  at that   time.        Gretta Arab RPA-C  Electronically signed and dictated using Chief Financial Officer     [facility]  [address]  [place] [address]     .  Allergies   Bee sting; Allergy; Anaphylaxis  Cephalexin CAPS.  Current Meds   EpiPen 2-Pak 0.3 MG/0.3ML Device;INJECT 0.3 ML INTRAMUSCULARLY AS DIRECTED;   Rx  Lansoprazole 30 MG Capsule Delayed Release;; RPT  PredniSONE 10 MG Tablet;; RPT  Pantoprazole Sodium 40 MG Tablet Delayed Release;TAKE 1 TABLET DAILY.; Rx  Valacyclovir HCl 1 GM Tablet;TAKE 1 TABLET 3 TIMES DAILY FOR 7 DAYS.; Rx  Ketek 400 MG Tablet;TAKE 2 TABLETS DAILY FOR 5 DAYS.; Rx  Ciprofloxacin HCl 500 MG Tablet;TAKE 1 TABLET EVERY 12 HOURS DAILY.; Rx  Famotidine 40 MG Tablet;; RPT  ALPRAZolam 0.25 MG Tablet;TAKE  1 TABLET TWICE DAILY AS NEEDED.; Rx.  Active Problems   Esophageal Reflux (530.81)  Reactive Airway Disease (493.90).  Vital Signs   Recorded by amccoy on 15 Mar 2010 11:09 AM  BP:120/70,   HR: 89 b/min,   Height: 67 in, Weight: 191.0 lb, BMI: 29.9 kg/m2,   O2 Sat: 94 (%SpO2).  Orders   Sertraline HCl 50 MG Tablet;TAKE 1 TABLET BY MOUTH ONCE DAILY; Qty30; R1;   Rx.  Signature   Electronically signed by: Gretta Arab  PA-C; 03/15/2010 11:40 AM EST.

## 2010-04-03 ENCOUNTER — Ambulatory Visit: Payer: Self-pay | Admitting: Internal Medicine

## 2010-04-04 ENCOUNTER — Ambulatory Visit: Payer: Self-pay | Admitting: Internal Medicine

## 2010-05-23 NOTE — Miscellaneous (Unsigned)
 Continuity of Care Record  Created: todo  From: [name]  From:   From: [employer], EHR v10.2.7.53  To: Leverich, Calum  Purpose: Patient Use;       Problems  Diagnosis: Anxiety Disorder NOS (300.00)   Diagnosis: Esophageal Reflux (530.81)   Problem: Laparoscopy Appendectomy  Diagnosis: Reactive Airway Disease (493.90)     Alerts  Allergy - Bee sting Anaphylaxis   Allergy - Cephalexin CAPS     Medications  ALPRAZolam 0.25 MG Tablet; TAKE 1 TABLET TWICE DAILY AS NEEDED. ; Rx   Ciprofloxacin HCl 500 MG Tablet; TAKE 1 TABLET EVERY 12 HOURS DAILY. ; Rx   EpiPen 2-Pak 0.3 MG/0.3ML Device; INJECT 0.3 ML INTRAMUSCULARLY AS DIRECTED   ; Rx   Famotidine 40 MG Tablet ; RPT   Ketek 400 MG Tablet; TAKE 2 TABLETS DAILY FOR 5 DAYS. ; Rx   Lansoprazole 30 MG Capsule Delayed Release ; RPT   Pantoprazole Sodium 40 MG Tablet Delayed Release; TAKE 1 TABLET DAILY. ; Rx   PredniSONE 10 MG Tablet ; RPT   Sertraline HCl 50 MG Tablet; TAKE 1 TABLET BY MOUTH ONCE DAILY ; Rx   Valacyclovir HCl 1 GM Tablet; TAKE 1 TABLET 3 TIMES DAILY FOR 7 DAYS. ; Rx

## 2010-10-21 ENCOUNTER — Other Ambulatory Visit: Payer: Self-pay | Admitting: Internal Medicine

## 2010-10-23 ENCOUNTER — Other Ambulatory Visit: Payer: Self-pay | Admitting: Primary Care

## 2010-10-23 MED ORDER — SERTRALINE HCL 50 MG PO TABS *I*
ORAL_TABLET | ORAL | Status: DC
Start: 2010-10-23 — End: 2010-12-20

## 2010-10-23 NOTE — Telephone Encounter (Signed)
 Pt requesting refill on medication

## 2010-12-20 ENCOUNTER — Other Ambulatory Visit: Payer: Self-pay | Admitting: Primary Care

## 2011-03-24 ENCOUNTER — Other Ambulatory Visit: Payer: Self-pay | Admitting: Primary Care

## 2011-04-24 ENCOUNTER — Other Ambulatory Visit: Payer: Self-pay | Admitting: Primary Care

## 2011-05-13 ENCOUNTER — Other Ambulatory Visit: Payer: Self-pay | Admitting: Primary Care

## 2011-05-27 ENCOUNTER — Other Ambulatory Visit: Payer: Self-pay | Admitting: Primary Care

## 2011-06-12 ENCOUNTER — Telehealth: Payer: Self-pay | Admitting: Primary Care

## 2011-06-12 MED ORDER — SERTRALINE HCL 50 MG PO TABS *I*
ORAL_TABLET | ORAL | Status: DC
Start: 2011-06-12 — End: 2011-07-11

## 2011-06-12 NOTE — Telephone Encounter (Signed)
Patient insurance has lapsed wondering if you can prescribe a few more months of script zoloft he is feeling great an will make appt soon as his insurance reactivates

## 2011-06-12 NOTE — Telephone Encounter (Signed)
Ok, done 

## 2011-06-12 NOTE — Telephone Encounter (Signed)
Called cell VM was full could not leave a message

## 2011-07-10 ENCOUNTER — Other Ambulatory Visit: Payer: Self-pay | Admitting: Primary Care

## 2011-07-11 ENCOUNTER — Other Ambulatory Visit: Payer: Self-pay | Admitting: Primary Care

## 2011-07-13 ENCOUNTER — Other Ambulatory Visit: Payer: Self-pay | Admitting: Primary Care

## 2011-07-13 NOTE — Telephone Encounter (Signed)
 Patient is scheduled to come in on Wednesday

## 2011-07-16 MED ORDER — SERTRALINE HCL 50 MG PO TABS *I*
ORAL_TABLET | ORAL | Status: DC
Start: 2011-07-16 — End: 2011-07-18

## 2011-07-18 ENCOUNTER — Encounter: Payer: Self-pay | Admitting: Primary Care

## 2011-07-18 ENCOUNTER — Ambulatory Visit: Payer: Self-pay | Admitting: Primary Care

## 2011-07-18 VITALS — BP 168/96 | HR 112 | Resp 16 | Ht 67.0 in | Wt 196.0 lb

## 2011-07-18 DIAGNOSIS — F419 Anxiety disorder, unspecified: Secondary | ICD-10-CM

## 2011-07-18 LAB — HM HIV SCREENING OFFERED

## 2011-07-18 MED ORDER — EPINEPHRINE 0.3 MG/0.3ML IJ DEVI *I*
INTRAMUSCULAR | Status: AC
Start: 2011-07-18 — End: ?

## 2011-07-18 MED ORDER — SERTRALINE HCL 50 MG PO TABS *I*
50.0000 mg | ORAL_TABLET | Freq: Every day | ORAL | Status: DC
Start: 2011-07-18 — End: 2012-03-07

## 2011-07-18 NOTE — Progress Notes (Signed)
Dictated by dragon and not reviewed    OFFICE VISIT    SUBJECTIVE:patient is here for a long overdue followup appointment.  He needs to get refills of his Zoloft he's been taken it for years for anxiety disorder and he works well on that.  He currently does not have insurance she's working for [employer] at [employer]  He states he is doing well denies being suicidal or homicidal  No other new complaints or problems. All other systems are negative.  Medications reviewed and list updated today  Patient denies trouble with eyes, ears, nose or throat,  patient denies trouble with breathing, chest pain, leg swelling or a funny heartbeats  patient denies heartburn, stomachache, diarrhea, or constipation  patient denies sores ,changing moles or itching  patient denies mood changes, sleeping problems, weight loss, night sweats  patient denies problems with urination or sexlife  patient denies pain or swelling in joints or muscles, weakness, numbness, or headaches  patient denies taking any herbal supplements are natural remedies  patient denies any family members with new serious medical problems  patient denies any recent falls  patient denies any new medical allergies  Patient started smoking again    OBJECTIVE:  Affect is normal  Heart regular rate and rhythm no murmurs.  Lungs clear to auscultation bilaterally no wheezes rhonchi or rales.    ASSESSMENT:mood swings chronic anxiety    PLAN:medications refilled, patient strongly urged to quit smoking again, given some recommendations for health care while he's in between insurance will see him back as needed    Electronically signed by  Marisue Humble L. Florene Glen.O.

## 2012-03-07 ENCOUNTER — Other Ambulatory Visit: Payer: Self-pay | Admitting: Primary Care

## 2012-03-10 MED ORDER — SERTRALINE HCL 50 MG PO TABS *I*
50.0000 mg | ORAL_TABLET | Freq: Every day | ORAL | Status: DC
Start: 2012-03-07 — End: 2012-06-15

## 2012-06-15 ENCOUNTER — Other Ambulatory Visit: Payer: Self-pay | Admitting: Primary Care

## 2014-06-25 ENCOUNTER — Encounter: Payer: Self-pay | Admitting: Primary Care

## 2014-07-13 ENCOUNTER — Telehealth: Payer: Self-pay | Admitting: Gastroenterology

## 2014-07-13 NOTE — Telephone Encounter (Signed)
Letter mailed to patient 06/25/14 returned. Patient taken off provider list.

## 2015-02-14 ENCOUNTER — Encounter: Payer: Self-pay | Admitting: *Deleted

## 2015-02-14 ENCOUNTER — Emergency Department
Admission: EM | Admit: 2015-02-14 | Discharge: 2015-02-14 | Disposition: A | Discharge status: Home / Self Care | Payer: Self-pay | Source: Home / Self Care | Attending: Family Medicine | Admitting: Family Medicine

## 2015-02-14 DIAGNOSIS — M791 Myalgia, unspecified site: Secondary | ICD-10-CM

## 2015-02-14 DIAGNOSIS — K219 Gastro-esophageal reflux disease without esophagitis: Secondary | ICD-10-CM

## 2015-02-14 DIAGNOSIS — IMO0001 Reserved for inherently not codable concepts without codable children: Secondary | ICD-10-CM

## 2015-02-14 DIAGNOSIS — R002 Palpitations: Secondary | ICD-10-CM

## 2015-02-14 DIAGNOSIS — R0789 Other chest pain: Secondary | ICD-10-CM

## 2015-02-14 DIAGNOSIS — R531 Weakness: Secondary | ICD-10-CM

## 2015-02-14 DIAGNOSIS — R030 Elevated blood-pressure reading, without diagnosis of hypertension: Secondary | ICD-10-CM

## 2015-02-14 MED ORDER — OMEPRAZOLE 20 MG PO CPDR: 20.0000 mg | DELAYED_RELEASE_CAPSULE | Freq: Every day | ORAL | Status: DC

## 2015-02-14 MED ORDER — LISINOPRIL 10 MG PO TABS: 10.0000 mg | ORAL_TABLET | Freq: Every day | ORAL | Status: DC

## 2015-02-14 NOTE — ED Notes (Signed)
Pt c/o feeling like his heart is racing, bilateral leg weakness, neck and shoulders tightness, and nausea x 1 mth, worse x 4-5 days. He took ASA  x 2 on the way here.

## 2015-02-14 NOTE — ED Provider Notes (Signed)
CSN: [id number]     Arrival date & time 02/14/15  1934 History   First MD Initiated Contact with Patient 02/14/15 1956     Chief Complaint  Patient presents with  . Tachycardia   (Consider location/radiation/quality/duration/timing/severity/associated sxs/prior Treatment) HPI  Pt is a 59yo male presenting to [facility] with c/o "not feeling well."  Pt states over the last 1 month he has developed persistent, gradually worsening generalized weakness with body aches including neck and shoulder tightness. Associated palpitations, bilateral leg weakness, and nausea but no vomiting. Pt also notes when he wakes in the morning he notices a tightness in his throat and feels like it is hard to swallow. He has been on Prilosec in the past but has not f/u with a PCP in several years due to lack of insure. Pt denies CP at this time but states he feels anxious. He did take  aspirin just PTA.  Denies family hx of CAD. States he had a cardiac workup including a normal stress test 7-8 years ago following a "blip" on his EKG.  He denies fever, cough, congestion, vomiting or diarrhea. Denies known tick bites but states he does walk his girlfriend's dogs sometimes in the woods. Denies sick contacts or recent travel.  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  He has been at this job for about 5-6 months.   History reviewed. No pertinent past medical history. Past Surgical History  Procedure Laterality Date  . Appendectomy     History reviewed. No pertinent family history. Social History  Substance Use Topics  . Smoking status: Current Every Day Smoker -- 0.25 packs/day    Types: Cigarettes  . Smokeless tobacco: None  . Alcohol Use: Yes     Comment: 1-2 beers q day    Review of Systems  Constitutional: Negative for fever and chills.  HENT: Negative for congestion, ear pain, sore throat, trouble swallowing and voice change.   Respiratory: Positive for chest  tightness. Negative for cough and shortness of breath.   Cardiovascular: Positive for palpitations. Negative for chest pain.  Gastrointestinal: Positive for nausea. Negative for vomiting, abdominal pain and diarrhea.  Musculoskeletal: Positive for myalgias, back pain ( shoulders) and neck pain. Negative for arthralgias.  Skin: Negative for rash.  All other systems reviewed and are negative.   Allergies  Bee venom and Cephalosporins  Home Medications   Prior to Admission medications   Medication Sig Start Date End Date Taking? Authorizing Provider  lisinopril (PRINIVIL,ZESTRIL) 10 MG tablet Take 1 tablet (10 mg total) by mouth daily. 02/14/15   Junius Finner, PA-C  omeprazole (PRILOSEC) 20 MG capsule Take 1 capsule (20 mg total) by mouth daily. 02/14/15   Junius Finner, PA-C   Meds Ordered and Administered this Visit  Medications - No data to display  BP 172/119 mmHg  Pulse 92  Temp(Src) 98 F (36.7 C) (Oral)  Resp 18  Ht  (1.702 m)  Wt 195 lb (88.451 kg)  BMI 30.53 kg/m2  SpO2 99% No data found.   Physical Exam  Constitutional: He appears well-developed and well-nourished.  HENT:  Head: Normocephalic and atraumatic.  Eyes: Conjunctivae are normal. No scleral icterus.  Neck: Normal range of motion. Neck supple.  No midline bone tenderness, no crepitus or step-offs.  No nuchal rigidity or meningeal signs.   Cardiovascular: Normal rate, regular rhythm and normal heart sounds.   Pulmonary/Chest: Effort normal and breath sounds normal. No  respiratory distress. He has no wheezes. He has no rales. He exhibits no tenderness.  Abdominal: Soft. He exhibits no distension and no mass. There is no tenderness. There is no rebound and no guarding.  Musculoskeletal: Normal range of motion.  Neurological: He is alert.  Skin: Skin is warm and dry.  Psychiatric: His mood appears anxious ( mild).  Nursing note and vitals reviewed.   ED Course  Procedures (including critical care  time)  Labs Review Labs Reviewed  COMPLETE METABOLIC PANEL WITH GFR  B. BURGDORFI ANTIBODIES  TSH  ROCKY MTN SPOTTED FVR ABS PNL(IGG+IGM)  SEDIMENTATION RATE  C-REACTIVE PROTEIN  CBC    Imaging Review No results found.  EKG: normal    MDM   1. Palpitations   2. Weakness   3. Myalgia   4. Chest tightness   5. Elevated blood pressure   6. Gastroesophageal reflux disease, esophagitis presence not specified     Pt c/o generalized weakness, chest tightness, palpitations, and nausea for 1 month. Pt denies CP or SOB at this time. EKG- normal Doubt ACS. Pt does have elevated BP 178/131, dropped to 172/119 after resting in UC. Due to lack of immediate f/u, will get lab work to r/o medical cause or possible infection of symptoms. Labs: CBC, CMP, TSH, B. burgdorfi antibodies; RMSF; Sed rate; and C-reactive protein to help r/o ploymyalgia rheumatica Due to elevated BP, will start pt on lisinopril and also omeprazole for possible GERD with c/o morning throat tightness. Resource guide for PCP provided.   Discussed symptoms that warrant emergent care in the ED. Patient verbalized understanding and agreement with treatment plan.    Junius Finner, PA-C 02/14/15 2019

## 2015-02-15 LAB — TSH: TSH: 1.666 u[IU]/mL (ref 0.350–4.500)

## 2015-02-15 LAB — CBC
HCT: 45.7 % (ref 39.0–52.0)
Hemoglobin: 15.7 g/dL (ref 13.0–17.0)
MCH: 31.5 pg (ref 26.0–34.0)
MCHC: 34.4 g/dL (ref 30.0–36.0)
MCV: 91.8 fL (ref 78.0–100.0)
MPV: 11.8 fL (ref 8.6–12.4)
Platelets: 250 10*3/uL (ref 150–400)
RBC: 4.98 MIL/uL (ref 4.22–5.81)
RDW: 14 % (ref 11.5–15.5)
WBC: 9.1 10*3/uL (ref 4.0–10.5)

## 2015-02-15 LAB — COMPLETE METABOLIC PANEL WITH GFR
ALT: 20 U/L (ref 9–46)
AST: 19 U/L (ref 10–35)
Albumin: 4.6 g/dL (ref 3.6–5.1)
Alkaline Phosphatase: 50 U/L (ref 40–115)
BUN: 12 mg/dL (ref 7–25)
CO2: 27 mmol/L (ref 20–31)
Calcium: 9.4 mg/dL (ref 8.6–10.3)
Chloride: 101 mmol/L (ref 98–110)
Creat: 0.77 mg/dL (ref 0.70–1.33)
GFR, Est African American: >89 mL/min (ref >=60)
GFR, Est Non African American: >89 mL/min (ref >=60)
Glucose, Bld: 67 mg/dL (ref 65–99)
Potassium: 4 mmol/L (ref 3.5–5.3)
Sodium: 139 mmol/L (ref 135–146)
Total Bilirubin: 0.6 mg/dL (ref 0.2–1.2)
Total Protein: 7.2 g/dL (ref 6.1–8.1)

## 2015-02-15 LAB — ROCKY MTN SPOTTED FVR ABS PNL(IGG+IGM)
RMSF IgG: 0.39 IV
RMSF IgM: 0.19 IV

## 2015-02-15 LAB — C-REACTIVE PROTEIN: CRP: <0.5 mg/dL (ref <0.60)

## 2015-02-15 LAB — SEDIMENTATION RATE: Sed Rate: 1 mm/hr (ref 0–20)

## 2015-02-15 LAB — LYME AB/WESTERN BLOT REFLEX: B burgdorferi Ab IgG+IgM: 0.63 {ISR}

## 2015-02-17 ENCOUNTER — Telehealth: Payer: Self-pay | Admitting: *Deleted

## 2015-02-18 ENCOUNTER — Ambulatory Visit (INDEPENDENT_AMBULATORY_CARE_PROVIDER_SITE_OTHER): Payer: Self-pay | Admitting: Osteopathic Medicine

## 2015-02-18 ENCOUNTER — Encounter: Payer: Self-pay | Admitting: Osteopathic Medicine

## 2015-02-18 VITALS — BP 142/98 | HR 86 | Ht 67.0 in | Wt 197.0 lb

## 2015-02-18 DIAGNOSIS — R002 Palpitations: Secondary | ICD-10-CM

## 2015-02-18 DIAGNOSIS — Z9289 Personal history of other medical treatment: Secondary | ICD-10-CM

## 2015-02-18 DIAGNOSIS — Z8659 Personal history of other mental and behavioral disorders: Secondary | ICD-10-CM

## 2015-02-18 DIAGNOSIS — I10 Essential (primary) hypertension: Secondary | ICD-10-CM

## 2015-02-18 DIAGNOSIS — K219 Gastro-esophageal reflux disease without esophagitis: Secondary | ICD-10-CM

## 2015-02-18 DIAGNOSIS — Z716 Tobacco abuse counseling: Secondary | ICD-10-CM

## 2015-02-18 DIAGNOSIS — G4720 Circadian rhythm sleep disorder, unspecified type: Secondary | ICD-10-CM

## 2015-02-18 HISTORY — DX: Personal history of other mental and behavioral disorders: Z86.59

## 2015-02-18 HISTORY — DX: Essential (primary) hypertension: I10

## 2015-02-18 MED ORDER — SERTRALINE HCL 25 MG PO TABS: 25.0000 mg | ORAL_TABLET | Freq: Every day | ORAL | Status: DC

## 2015-02-18 MED ORDER — LISINOPRIL 10 MG PO TABS: 10.0000 mg | ORAL_TABLET | Freq: Every day | ORAL | Status: DC

## 2015-02-18 MED ORDER — OMEPRAZOLE 20 MG PO CPDR: 20.0000 mg | DELAYED_RELEASE_CAPSULE | Freq: Every day | ORAL | Status: DC

## 2015-02-18 NOTE — Progress Notes (Signed)
HPI: Brandon Conrad is a 59 y.o. male who presents to [facility]  today for chief complaint of:  Chief Complaint  Patient presents with  . Establish Care  . Hypertension   Patient was recently seen in urgent care, complaints of tachycardia and chest pressure. At that time, blood pressure was elevated, EKG was normal, labs including CBC, CMP, TSH, [facility] spotted fever antibody IgG and IgM, Lyme antibodies, sedimentation rate and CRP, were normal. . Quality: some palpitations, chest tightness . Severity: mild to moderate . Duration: 1 month . Context: feeling stresed but "not really under any stress",  . 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 Has had stress test and echo int he past which were normal. Taking supplements including probiotics, selenium, multivitamin . Assoc signs/symptoms: tightness in neck and shoulders, shortness of breath. Hx anxiety, was on sertraline, denies depression/anxiety now but the other night (prior to going to urgent care) was feeling like a panic attack.   HTN - NEW DIAGNOSIS - usually takes medications around 1:00. Home measurements SBP >170s. Only took thi smeasurement once with arm cuff.   TOBACCO - on and off smokers for several years but recently quit.   HX ANXIETY/STRESS - previously on sertraline which helped in the past, denies problems with anxiety/depression but reports high stress levels at work and erratic sleep schedule.     Past medical, social and family history reviewed: No past medical history on file. Past Surgical History  Procedure Laterality Date  . Appendectomy     Social History  Substance Use Topics  . Smoking status: Current Every Day Smoker -- 0.25 packs/day    Types: Cigarettes  . Smokeless tobacco: Not on file  . Alcohol Use: Yes     Comment: 1-2 beers q day   No family history on file.  Current Outpatient Prescriptions  Medication  Sig Dispense Refill  . lisinopril (PRINIVIL,ZESTRIL) 10 MG tablet Take 1 tablet (10 mg total) by mouth daily. 30 tablet 0  . omeprazole (PRILOSEC) 20 MG capsule Take 1 capsule (20 mg total) by mouth daily. 30 capsule 0   No current facility-administered medications for this visit.   Allergies  Allergen Reactions  . Bee Venom   . Cephalosporins       Review of Systems: CONSTITUTIONAL: Neg fever/chills, no unintentional weight changes HEAD/EYES/EARS/NOSE/THROAT: No headache/vision change or hearing change, no sore throat CARDIAC: No chest pain/pressure/palpitations at the moment - vague complaints of this happening now and then, getting worse, see hpi, no orthopnea RESPIRATORY: No cough/shortness of breath/wheeze GASTROINTESTINAL: No nausea/vomiting/abdominal pain/blood in stool/diarrhea/constipation MUSCULOSKELETAL: No myalgia/arthralgia GENITOURINARY: No incontinence, No abnormal genital bleeding/discharge SKIN: No rash/wounds/concerning lesions HEM/ONC: No easy bruising/bleeding, no abnormal lymph node ENDOCRINE: No polyuria/polydipsia/polyphagia, no heat/cold intolerance  NEUROLOGIC: No weakness/dizzines/slurred speech PSYCHIATRIC: No concerns with depression/anxiety or sleep problems    Exam:  BP 142/98 mmHg  Pulse 86  Ht  (1.702 m)  Wt 197 lb (89.359 kg)  BMI 30.85 kg/m2 Constitutional: VSS, see above. General Appearance: alert, well-developed, well-nourished, NAD Eyes: Normal lids and conjunctive, non-icteric sclera, PERRLA Ears, Nose, Mouth, Throat: Normal external inspection ears/nares/mouth/lips/gums,MMM,  Neck: No masses, trachea midline. No thyroid enlargement/tenderness/mass appreciated Respiratory: Normal respiratory effort. no wheeze/rhonchi/rales Cardiovascular: S1/S2 normal, no murmur/rub/gallop auscultated. RRR. No carotid bruit or JVD. No abdominal aortic bruit.  No lower extremity edema. Gastrointestinal: Nontender, no masses. No hepatomegaly, no  splenomegaly. No hernia appreciated. Bowel sounds normal.  Rectal exam deferred.  Musculoskeletal: Gait normal. No clubbing/cyanosis of digits.  Neurological: No cranial nerve deficit on limited exam. Motor and sensation intact and symmetric Psychiatric: Normal judgment/insight. Normal mood and affect. Oriented x3.    No results found for this or any previous visit (from the past 72 hour(s)).  Labs from 02/14/15 reviewed as above   ASSESSMENT/PLAN:  Essential hypertension - Instructions for RTC nurse visit BP check and calibrate his home cuff, may consider up ACE or add BB - Plan: lisinopril (PRINIVIL,ZESTRIL) 10 MG tablet  Palpitations - vague descriptions, worse with elevated BP, EKG in UC normal, labs normal, consdier Holter but pt can't afford, ER precautions reviewed  Encounter for tobacco use cessation counseling - recently quit smoking, keep up the good work!  History of episode of anxiety - previously on Sertaline, will restart this.  - Plan: sertraline (ZOLOFT) 25 MG tablet  Circadian rhythm disorder - Pt seeking new job, consider melatonin - Plan: sertraline (ZOLOFT) 25 MG tablet  Gastroesophageal reflux disease without esophagitis - Plan: omeprazole (PRILOSEC) 20 MG capsule     Vague complaints of chest pressure/palpitations in light of normal labs, EKG, physical exam. Hypertension needs to be addressed, particularly if patient's home readings SBP greater than 170 accurate, see patient instructions regarding his follow-up in this, needs to come back for nurse visit for his cuff calibrated. May consider increasing lisinopril if BP still elevated, patient advised to keep log of blood pressures and bring these to all visits. Patient advised of routine screening needed for anyone with diagnosis of hypertension, including need to catch up on lipid screening, annual screening for diabetes. Patient is working on getting a job with insurance. No hypertension control is primary concern,  patient also is restarted on sertraline, I suspect psychiatric component though he is and I'll of any problems with depression or anxiety. Counseled that anxiety/circadian rhythm disorder can certainly manifest in physical symptoms, since all other workup negative at this point I think it's worth trying restarting Zoloft. ER precautions reviewed with regard to chest pain, shortness of breath, other concerns. Close follow-up to ensure blood pressure control. Recommend daily aspirin. Cholesterol labs and repeat BMP in 2-3 months, AWAIT official ASCVD risk at that time once cholesterol numbers are available.

## 2015-02-18 NOTE — Patient Instructions (Signed)
FOLLOW UP NEXT WEEK FOR NURSE VISIT -BRING BLOOD PRESSURE MONITOR TO COMPARE TO OUR EQUIPMENT HERE TO ENSURE ACCURACY -IF BLOOD PRESSURE STILL HIGH, MAY NEED TO CONSIDER ADDING MEDICINE OR INCREASING LISINOPRIL DOSE  HYPERTENSION -RECHECK BLOOD WORK IN 2-3 MONTHS, WE WILL CHECK CHOLESTEROL AT THIS TIME, TOO, PLEASE GET THESE LABS DONE FASTING (NO FOOD 8-12 HOURS PRIOR TO BLOOD DRAW) A FEW DAYS PRIOR TO YOUR APPOINTMENT SO WE CAN DISCUSS THE RESULTS AT YOUR VISIT.  -KEEP A RECORD OF YOUR BLOOD PRESSURE READINGS AT HOME AND BRING THESE TO YOUR DOCTOR VISITS -RECOMMEND START ASPIRIN 81 MG DAILY  OTHER SYMPTOMS -CAN TRY GETTING BACK ON SERTRALINE AND SEE IF THIS IMPROVES THINGS -WOULD LIKE TO SEE YOU FEELING STABLE FOR AT LEAST 6 MONTHS BEFORE STOPPING THIS MEDICINE, AND NEED TO TAPER DOWN SLOWLY IF/WHEN IT IS STOPPED.  -WE HAVE STARTED YOU ON A LOW DOSE AND WE MIGHT NEED TO CONSIDER INCREASING THIS DOSE IF NEEDED.   ANY CHEST PAIN OR OTHER ACUTE CONCERNS, LET us KNOW OR GO TO THE ER!

## 2015-02-23 ENCOUNTER — Telehealth: Payer: Self-pay | Admitting: *Deleted

## 2015-02-23 DIAGNOSIS — F32A Depression, unspecified: Secondary | ICD-10-CM

## 2015-02-23 DIAGNOSIS — F329 Major depressive disorder, single episode, unspecified: Secondary | ICD-10-CM

## 2015-02-23 NOTE — Telephone Encounter (Signed)
Pt called today with some slight concerns regarding the sertraline. Even though he has taken it in the past and has only been on it for 5 days this go round, he woke up this morning "in a dark place".  He stated that he's heard a lot of "horror stories" about this and just would feel more comfortable trying something else instead.

## 2015-02-24 MED ORDER — FLUOXETINE HCL 20 MG PO TABS: 20.0000 mg | ORAL_TABLET | Freq: Every day | ORAL | Status: DC

## 2015-02-24 NOTE — Telephone Encounter (Signed)
Pt notified of new rx.

## 2015-02-24 NOTE — Telephone Encounter (Signed)
Please call patient - I've sent in alternative! Thanks.

## 2015-03-28 ENCOUNTER — Encounter: Payer: Self-pay | Admitting: Osteopathic Medicine

## 2015-03-28 ENCOUNTER — Ambulatory Visit (INDEPENDENT_AMBULATORY_CARE_PROVIDER_SITE_OTHER): Payer: Self-pay | Admitting: Osteopathic Medicine

## 2015-03-28 VITALS — BP 163/95 | HR 75 | Ht 67.0 in | Wt 192.0 lb

## 2015-03-28 DIAGNOSIS — I10 Essential (primary) hypertension: Secondary | ICD-10-CM

## 2015-03-28 DIAGNOSIS — F329 Major depressive disorder, single episode, unspecified: Secondary | ICD-10-CM

## 2015-03-28 DIAGNOSIS — F32A Depression, unspecified: Secondary | ICD-10-CM

## 2015-03-28 DIAGNOSIS — F411 Generalized anxiety disorder: Secondary | ICD-10-CM

## 2015-03-28 MED ORDER — ESCITALOPRAM OXALATE 20 MG PO TABS: ORAL_TABLET | ORAL | Status: DC

## 2015-03-28 MED ORDER — LISINOPRIL 10 MG PO TABS: 10.0000 mg | ORAL_TABLET | Freq: Every day | ORAL | Status: DC

## 2015-03-28 NOTE — Progress Notes (Signed)
HPI: Brandon Conrad is a 59 y.o. male who presents to [facility]  today for chief complaint of:  Chief Complaint  Patient presents with  . Follow-up    anxiety, HTN   HTN - Uncontrolled - usually takes medications around 1:00. Has been out of blood pressure medications for about a week. Pt did not follow-up for BP recheck in office or calibrate his home cuff. No chest pain, no chest pressure, no shortness of breath.  HX ANXIETY/STRESS - previously on sertraline which helped in the past, denies problems with anxiety/depression but reports high stress levels at work and erratic sleep schedule. This was restarted last visit but pt unable to tolerate it, switched to Prozac . Says this makes him feel mentally foggy and would like to switch. Patient states that increase sleepiness is an issue for him, thinks mostly due to the Prozac    Past medical, social and family history reviewed: No past medical history on file. Past Surgical History  Procedure Laterality Date  . Appendectomy  2011   Social History  Substance Use Topics  . Smoking status: Former Smoker -- 0.25 packs/day    Types: Cigarettes    Quit date: 02/16/2015  . Smokeless tobacco: Not on file  . Alcohol Use: 7.2 oz/week    12 Standard drinks or equivalent per week     Comment: 1-2 beers q day   No family history on file.  Current Outpatient Prescriptions  Medication Sig Dispense Refill  . FLUoxetine (PROZAC) 20 MG tablet Take 1 tablet (20 mg total) by mouth daily. 30 tablet 3  . lisinopril (PRINIVIL,ZESTRIL) 10 MG tablet Take 1 tablet (10 mg total) by mouth daily. 30 tablet 0  . omeprazole (PRILOSEC) 20 MG capsule Take 1 capsule (20 mg total) by mouth daily. 30 capsule 0   No current facility-administered medications for this visit.   Allergies  Allergen Reactions  . Bee Venom   . Cephalosporins       Review of Systems: CONSTITUTIONAL: Neg fever/chills, no unintentional weight  changes HEAD/EYES/EARS/NOSE/THROAT: No headache/vision change or hearing change, no sore throat CARDIAC: No chest pain/pressure/palpitations at the moment - vague complaints of this happening now and then, getting worse, see hpi, no orthopnea RESPIRATORY: No cough/shortness of breath/wheeze NEUROLOGIC: No weakness/dizzines/slurred speech PSYCHIATRIC: No concerns with depression, (+) concerns with anxiety (+) sleep problems    Exam:  BP 163/95 mmHg  Pulse 75  Ht  (1.702 m)  Wt 192 lb (87.091 kg)  BMI 30.06 kg/m2 Constitutional: VSS, see above. General Appearance: alert, well-developed, well-nourished, NAD Eyes: Normal lids and conjunctive, non-icteric sclera,  Respiratory: Normal respiratory effort. no wheeze/rhonchi/rales Cardiovascular: S1/S2 normal, no murmur/rub/gallop auscultated. RRR. No carotid bruit or JVD. No abdominal aortic bruit.  No lower extremity edema. Psychiatric: Normal judgment/insight. Normal mood and affect. Oriented x3.    No results found for this or any previous visit (from the past 72 hour(s)).  Labs from 02/14/15 reviewed as above   ASSESSMENT/PLAN:  Essential hypertension - Plan: lisinopril (PRINIVIL,ZESTRIL) 10 MG tablet  Depression - Plan: escitalopram (LEXAPRO) 20 MG tablet  Essential hypertension - Instructions for RTC nurse visit BP check and calibrate his home cuff, may consider up ACE or add BB - Plan: lisinopril (PRINIVIL,ZESTRIL) 10 MG tablet     Patient was advised we will change his medications for anxiety, if he is not successful with this medication and will need to insist on him seeking counseling even on a self-pay  basis. Due to insurance restrictions/self-pay, patient unable to afford counseling at this time. Patient declines a list of self-pay options at this time. Patient is concerned that his blood pressure may be causing anxiety problems, advised him need for close follow-up as directed withvisits to address blood pressure, ensure  he is on appropriate medication. The patient is advised on risk of long-term uncontrolled blood pressure. ER precautions are reviewed with regard to chest pain/blood pressure issues as well as anxiety/depression issues.  No Follow-up on file.

## 2015-03-28 NOTE — Patient Instructions (Signed)
BRING BLOOD PRESSURE CUFF TO NURSE VISIT IN ONE WEEK, WE WILL RECHECK BLOOD PRESSURE.   CALL/SCHEDULE VISIT SOONER IF ANY CONCERNS!   OTHERWISE RETURN FOR MEDICATION MONITORING IN 1 - 2 MONTHS OR AS NEEDED.

## 2015-04-04 ENCOUNTER — Ambulatory Visit: Payer: Self-pay

## 2015-04-11 ENCOUNTER — Ambulatory Visit (INDEPENDENT_AMBULATORY_CARE_PROVIDER_SITE_OTHER): Payer: Self-pay | Admitting: Osteopathic Medicine

## 2015-04-11 VITALS — BP 135/84 | HR 73

## 2015-04-11 DIAGNOSIS — I10 Essential (primary) hypertension: Secondary | ICD-10-CM

## 2015-04-11 DIAGNOSIS — F411 Generalized anxiety disorder: Secondary | ICD-10-CM

## 2015-04-11 NOTE — Progress Notes (Signed)
If Lexapro is working well for patient, can go to [employer] website and print coupon, then [facility] [place]13 a [facility], other places are also a bit cheaper depending where he wants to go. Please call and let him know.

## 2015-04-11 NOTE — Progress Notes (Signed)
   Subjective:    Patient ID: Brandon DiverScott Conrad, male    DOB: 07/12/1955, 59 y.o.   MRN: [id number]  HPI  Hypertension - Patient is here for a blood pressure check. Denies any problems with Lisinopril.   Anxiety - Patient is happy with the Lexapro. The medication is helping with his anxiety. The cost of the medication is too much to pay monthly. Is there a similar medication to the Lexapro on the [facility] drug list. [facility] has Paroxetine 10 mg and 20 mg and Citalopram 20 mg and 40 mg.   Review of Systems     Objective:   Physical Exam        Assessment & Plan:  Hypertension - blood pressure was within normal limits. Patient advised to continue taking medication as directed.

## 2015-04-12 NOTE — Progress Notes (Signed)
Left detailed message advising of [facility].

## 2015-04-25 ENCOUNTER — Encounter: Payer: Self-pay | Admitting: Osteopathic Medicine

## 2015-04-25 ENCOUNTER — Ambulatory Visit (INDEPENDENT_AMBULATORY_CARE_PROVIDER_SITE_OTHER): Payer: Self-pay | Admitting: Osteopathic Medicine

## 2015-04-25 VITALS — BP 140/91 | HR 104 | Temp 98.4°F | Ht 67.0 in | Wt 194.0 lb

## 2015-04-25 DIAGNOSIS — L509 Urticaria, unspecified: Secondary | ICD-10-CM

## 2015-04-25 DIAGNOSIS — Z9289 Personal history of other medical treatment: Secondary | ICD-10-CM

## 2015-04-25 DIAGNOSIS — F411 Generalized anxiety disorder: Secondary | ICD-10-CM

## 2015-04-25 DIAGNOSIS — I10 Essential (primary) hypertension: Secondary | ICD-10-CM

## 2015-04-25 DIAGNOSIS — Z8659 Personal history of other mental and behavioral disorders: Secondary | ICD-10-CM

## 2015-04-25 MED ORDER — PREDNISONE 10 MG (48) PO TBPK: ORAL_TABLET | Freq: Every day | ORAL | Status: DC

## 2015-04-25 MED ORDER — HYDROCHLOROTHIAZIDE 12.5 MG PO TABS: 12.5000 mg | ORAL_TABLET | Freq: Every day | ORAL | Status: DC

## 2015-04-25 MED ORDER — METOPROLOL SUCCINATE ER 25 MG PO TB24: 25.0000 mg | ORAL_TABLET | Freq: Every day | ORAL | Status: DC

## 2015-04-25 NOTE — Progress Notes (Signed)
HPI: Brandon Conrad is a 59 y.o. male who [facility] today for chief complaint of:  Chief Complaint  Patient presents with  . Rash   . Location: Generalized over her entire body . Quality: Itching, redness . Duration: Past few days . Context: Patient thinks may be due to Lexapro or lisinopril, no other changes in soaps or detergents, no other exposures that he knows of, no unusual foods. . Modifying factors: Patient took Benadryl, this resolved the rash somewhat, however is in his Benadryl wore off the rash seems to come back. Patient has stopped lisinopril as of 3 days ago, concerned about stopping the Lexapro due to possible withdrawal. . Assoc signs/symptoms: No fever or chills, no skin sloughing,   Past medical, social and family history reviewed: Past Medical History  Diagnosis Date  . Hypertension   . Anxiety    Past Surgical History  Procedure Laterality Date  . Appendectomy  2011   Social History  Substance Use Topics  . Smoking status: Former Smoker -- 0.25 packs/day    Types: Cigarettes    Quit date: 02/16/2015  . Smokeless tobacco: Not on file  . Alcohol Use: 7.2 oz/week    12 Standard drinks or equivalent per week     Comment: 1-2 beers q day   No family history on file.  Current Outpatient Prescriptions  Medication Sig Dispense Refill  . escitalopram (LEXAPRO) 20 MG tablet One half tab daily for a week then one tab by mouth daily 30 tablet 0  . lisinopril (PRINIVIL,ZESTRIL) 10 MG tablet Take 1 tablet (10 mg total) by mouth daily. 30 tablet 0   No current facility-administered medications for this visit.   Allergies  Allergen Reactions  . Bee Venom   . Cephalosporins       Review of Systems: CONSTITUTIONAL:  No  fever, no chills, No  unintentional weight changes CARDIAC: No  chest pain, No  pressure, No palpitations, No  orthopnea RESPIRATORY: No  cough, No  shortness of breath/wheeze SKIN: Yes   rash/wounds/concerning lesions NEUROLOGIC: No  weakness, No  dizziness, No  slurred speech PSYCHIATRIC: No  concerns with depression, Yes  concerns with anxiety, No sleep problems    Exam:  BP 140/91 mmHg  Pulse 104  Temp(Src) 98.4 F (36.9 C) (Oral)  Ht 5\' 7"  (1.702 m)  Wt 194 lb (87.998 kg)  BMI 30.38 kg/m2 Constitutional: VS see above. General Appearance: alert, well-developed, well-nourished, NAD Respiratory: Normal respiratory effort. no wheeze, no rhonchi, no rales Cardiovascular: S1/S2 normal, no murmur, no rub/gallop auscultated. RRR auscultation despite mild tachycardia on nurse vitals as above. Gastrointestinal: Nontender, no masses. Musculoskeletal: Gait normal. No clubbing/cyanosis of digits.  Neurological: No cranial nerve deficit on limited exam. Motor and sensation intact and symmetric Skin: warm, dry, intact. Positive diffuse urticarial rash, no target lesions, no sloughing of skin Psychiatric: Normal judgment/insight. Normal mood and affect. Oriented x3.    ASSESSMENT/PLAN: Child prednisone pack, continue Benadryl at home. Patient given instructions to taper off of the Lexapro, will also stop lisinopril and switched to beta blocker as below, hopefully this will help with hypertension as well as anxiety issues.  Urticarial rash - Plan: predniSONE (STERAPRED UNI-PAK 48 TAB) 10 MG (48) TBPK tablet  Essential hypertension - Plan: metoprolol succinate (TOPROL-XL) 25 MG 24 hr tablet, DISCONTINUED: hydrochlorothiazide (HYDRODIURIL) 12.5 MG tablet  Anxiety state   Return if symptoms worsen or fail to improve.

## 2015-04-25 NOTE — Patient Instructions (Signed)
Take Escitalopram 1/2 tablet for 5 days then stop the medicine.

## 2015-05-25 ENCOUNTER — Ambulatory Visit (INDEPENDENT_AMBULATORY_CARE_PROVIDER_SITE_OTHER): Payer: Self-pay | Admitting: Osteopathic Medicine

## 2015-05-25 ENCOUNTER — Encounter: Payer: Self-pay | Admitting: Osteopathic Medicine

## 2015-05-25 VITALS — BP 135/80 | HR 82 | Ht 67.0 in | Wt 201.0 lb

## 2015-05-25 DIAGNOSIS — I10 Essential (primary) hypertension: Secondary | ICD-10-CM

## 2015-05-25 MED ORDER — METOPROLOL SUCCINATE ER 25 MG PO TB24: 25.0000 mg | ORAL_TABLET | Freq: Every day | ORAL | Status: DC

## 2015-05-25 NOTE — Progress Notes (Signed)
HPI: Brandon Conrad is a 60 y.o. male who presents to [facility] today for chief complaint of:  Chief Complaint  Patient presents with  . Follow-up    BLOOD PRESSURE    HTN: BP about the same as last visit. Pt on Toprol XL, previously on initially Lisinopril and later stopped this due to concern for rash, we tried beta blocker to help with anxiety symptoms since patient was dissatisfied with SSRI treatment Lexapro (possible rash), Zoloft and Prozac. Doing well on the Toprol, feels "a bit more mellow" though stil some stress at work and financial issues. NO chest pain/pressure,/SOB.    Past medical, social and family history reviewed: Past Medical History  Diagnosis Date  . Hypertension   . Anxiety    Past Surgical History  Procedure Laterality Date  . Appendectomy  2011   Social History  Substance Use Topics  . Smoking status: Former Smoker -- 0.25 packs/day    Types: Cigarettes    Quit date: 02/16/2015  . Smokeless tobacco: Not on file  . Alcohol Use: 7.2 oz/week    12 Standard drinks or equivalent per week     Comment: 1-2 beers q day   No family history on file.  Current Outpatient Prescriptions  Medication Sig Dispense Refill  . metoprolol succinate (TOPROL-XL) 25 MG 24 hr tablet Take 1 tablet (25 mg total) by mouth daily. 30 tablet 1   No current facility-administered medications for this visit.   Allergies  Allergen Reactions  . Bee Venom   . Cephalosporins       Review of Systems: CONSTITUTIONAL:  No  fever, no chills, No  unintentional weight changes CARDIAC: No  chest pain, No  pressure, No palpitations, No  orthopnea RESPIRATORY: No  cough, No  shortness of breath/wheeze PSYCHIATRIC: No  concerns with depression, (+) concerns with anxiety but controlled on Beta Blocker, No sleep problems  Exam:  BP 135/80 mmHg  Pulse 82  Ht 5\' 7"  (1.702 m)  Wt 201 lb (91.173 kg)  BMI 31.47 kg/m2 BP manually checked by Dr  A Constitutional: VS see above. General Appearance: alert, well-developed, well-nourished, NAD Eyes: Normal lids and conjunctive, non-icteric sclera,  Ears, Nose, Mouth, Throat: MMM, Normal external inspection ears/nares/mouth/lips/gums, Respiratory: Normal respiratory effort. no wheeze, no rhonchi, no rales Cardiovascular: S1/S2 normal, no murmur, no rub/gallop auscultated. RRR.   Psychiatric: Normal judgment/insight. Normal mood and affect. Oriented x3.    ASSESSMENT/PLAN: Savings coupon and 90-day supply written for the Toprol-XL. Pt satisfied on this medicine. RTC 3 mos.   Essential hypertension - Plan: metoprolol succinate (TOPROL-XL) 25 MG 24 hr tablet   Return in about 3 months (around 08/23/2015), or if sooner if needed, for BLOOD PRESSURE FOLLOWUP .

## 2015-08-23 ENCOUNTER — Ambulatory Visit: Payer: Self-pay | Admitting: Osteopathic Medicine

## 2016-06-22 ENCOUNTER — Encounter: Payer: Self-pay | Admitting: Osteopathic Medicine

## 2016-06-22 ENCOUNTER — Ambulatory Visit (INDEPENDENT_AMBULATORY_CARE_PROVIDER_SITE_OTHER): Payer: BLUE CROSS/BLUE SHIELD | Admitting: Osteopathic Medicine

## 2016-06-22 VITALS — BP 162/93 | HR 74 | Ht 67.0 in | Wt 199.0 lb

## 2016-06-22 DIAGNOSIS — Z1159 Encounter for screening for other viral diseases: Secondary | ICD-10-CM | POA: Diagnosis not present

## 2016-06-22 DIAGNOSIS — I10 Essential (primary) hypertension: Secondary | ICD-10-CM | POA: Diagnosis not present

## 2016-06-22 DIAGNOSIS — R5383 Other fatigue: Secondary | ICD-10-CM

## 2016-06-22 DIAGNOSIS — R42 Dizziness and giddiness: Secondary | ICD-10-CM | POA: Diagnosis not present

## 2016-06-22 DIAGNOSIS — Z114 Encounter for screening for human immunodeficiency virus [HIV]: Secondary | ICD-10-CM | POA: Diagnosis not present

## 2016-06-22 DIAGNOSIS — N486 Induration penis plastica: Secondary | ICD-10-CM

## 2016-06-22 DIAGNOSIS — R202 Paresthesia of skin: Secondary | ICD-10-CM

## 2016-06-22 DIAGNOSIS — R200 Anesthesia of skin: Secondary | ICD-10-CM

## 2016-06-22 LAB — CBC WITH DIFFERENTIAL/PLATELET
BASOS PCT: 0 %
Basophils Absolute: 0 cells/uL (ref 0–200)
Eosinophils Absolute: 380 cells/uL (ref 15–500)
Eosinophils Relative: 5 %
HCT: 44.7 % (ref 38.5–50.0)
HEMOGLOBIN: 15 g/dL (ref 13.2–17.1)
LYMPHS ABS: 2660 {cells}/uL (ref 850–3900)
Lymphocytes Relative: 35 %
MCH: 31.1 pg (ref 27.0–33.0)
MCHC: 33.6 g/dL (ref 32.0–36.0)
MCV: 92.5 fL (ref 80.0–100.0)
MPV: 12.4 fL (ref 7.5–12.5)
Monocytes Absolute: 988 cells/uL — ABNORMAL HIGH (ref 200–950)
Monocytes Relative: 13 %
NEUTROS PCT: 47 %
Neutro Abs: 3572 cells/uL (ref 1500–7800)
Platelets: 231 10*3/uL (ref 140–400)
RBC: 4.83 MIL/uL (ref 4.20–5.80)
RDW: 13.9 % (ref 11.0–15.0)
WBC: 7.6 10*3/uL (ref 3.8–10.8)

## 2016-06-22 LAB — COMPLETE METABOLIC PANEL WITH GFR
ALBUMIN: 4.5 g/dL (ref 3.6–5.1)
ALK PHOS: 43 U/L (ref 40–115)
ALT: 17 U/L (ref 9–46)
AST: 17 U/L (ref 10–35)
BUN: 14 mg/dL (ref 7–25)
CO2: 22 mmol/L (ref 20–31)
Calcium: 9.4 mg/dL (ref 8.6–10.3)
Chloride: 107 mmol/L (ref 98–110)
Creat: 0.86 mg/dL (ref 0.70–1.33)
GFR, Est African American: >89 mL/min (ref >=60)
GFR, Est Non African American: >89 mL/min (ref >=60)
GLUCOSE: 93 mg/dL (ref 65–99)
POTASSIUM: 4.1 mmol/L (ref 3.5–5.3)
SODIUM: 139 mmol/L (ref 135–146)
Total Bilirubin: 0.7 mg/dL (ref 0.2–1.2)
Total Protein: 6.7 g/dL (ref 6.1–8.1)

## 2016-06-22 LAB — LIPID PANEL
CHOL/HDL RATIO: 5.5 ratio — AB (ref <5.0)
Cholesterol: 205 mg/dL — ABNORMAL HIGH (ref <200)
HDL: 37 mg/dL — ABNORMAL LOW (ref >40)
LDL Cholesterol: 112 mg/dL — ABNORMAL HIGH (ref <100)
Triglycerides: 281 mg/dL — ABNORMAL HIGH (ref <150)
VLDL: 56 mg/dL — AB (ref <30)

## 2016-06-22 LAB — TSH: TSH: 0.8 m[IU]/L (ref 0.40–4.50)

## 2016-06-22 MED ORDER — CHLORTHALIDONE 25 MG PO TABS: 25.0000 mg | ORAL_TABLET | Freq: Every day | ORAL | 1 refills | Status: DC

## 2016-06-22 NOTE — Progress Notes (Signed)
HPI: Brandon Conrad is a 61 y.o. male  who presents to [place] today, 06/22/16,  for chief complaint of:  Chief Complaint  Patient presents with  . Dizziness    Hypertension and associated symptoms . Context: out of BP meds for some time. We had initially switch from ACE inhibitor to beta blocker due to concern for rash from Ace and anxiety issues hopefully would be helped by beta blocker medication. Patient has been out of blood pressure medications for a few months. Has insurance again now like to get back on medicines . Quality: occasional lightheaded but no visio change, headache, LOC, vertigo, fall. Feels fatigued. Pt states she think high blood pressure is the main reason he has been feeling unwell.  . Severity: Mild, per patient . Duration: Few months . Timing: intermittent. Not particularly interfering with activities of daily living . Assoc signs/symptoms: No chest pain, pressure, shortness of breath  Occasional numbness/tingling lower eczema use. No instability/fall. No cyanosis. No restless leg  Penile concern: Patient reports band of scar tissue on shaft of penis, causes crookedness with erection, nonpainful. No abnormal penile discharge or bleeding, no swelling/pain testicular    Past medical history, surgical history, social history and family history reviewed.  Patient Active Problem List   Diagnosis Date Noted  . Anxiety state 03/28/2015  . Palpitations 02/18/2015  . Essential hypertension 02/18/2015  . Circadian rhythm disorder 02/18/2015  . History of episode of anxiety 02/18/2015  . Encounter for tobacco use cessation counseling 02/18/2015    Current medication list and allergy/intolerance information reviewed.   Current Outpatient Prescriptions on File Prior to Visit  Medication Sig Dispense Refill  . metoprolol succinate (TOPROL-XL) 25 MG 24 hr tablet Take 1 tablet (25 mg total) by mouth daily. (Patient not taking: Reported  on 06/22/2016) 90 tablet 0   No current facility-administered medications on file prior to visit.    Allergies  Allergen Reactions  . Bee Venom   . Cephalosporins       Review of Systems:  Constitutional: No recent illness, +fatigue  HEENT: No  headache, no vision change  Cardiac: No  chest pain, No  pressure, No palpitations  Respiratory:  No  shortness of breath. No  Cough  Neurologic: +generalied weakness, +occasional Dizziness  Psychiatric: No  concerns with depression, +concerns with anxiety  Exam:  BP (!) 162/93   Pulse 74   Ht _0  (1.702 m)   Wt 199 lb (90.3 kg)   BMI 31.17 kg/m   Constitutional: VS see above. General Appearance: alert, well-developed, well-nourished, NAD  Eyes: Normal lids and conjunctive, non-icteric sclera  Ears, Nose, Mouth, Throat: MMM, Normal external inspection ears/nares/mouth/lips/gums.  Neck: No masses, trachea midline.   Respiratory: Normal respiratory effort. no wheeze, no rhonchi, no rales  Cardiovascular: S1/S2 normal, no murmur, no rub/gallop auscultated. RRR.   Musculoskeletal: Gait normal. Symmetric and independent movement of all extremities  Neurological: Normal balance/coordination. No tremor.  Skin: warm, dry, intact.   Psychiatric: Normal judgment/insight. Normal mood and affect. Oriented x3.    Recent Results (from the past 2160 hour(s))  CBC with Differential/Platelet     Status: Abnormal   Collection Time: 06/22/16 11:39 AM  Result Value Ref Range   WBC 7.6 3.8 - 10.8 K/uL   RBC 4.83 4.20 - 5.80 MIL/uL   Hemoglobin 15.0 13.2 - 17.1 g/dL   HCT 44.7 38.5 - 50.0 %   MCV 92.5 80.0 - 100.0 fL   MCH  31.1 27.0 - 33.0 pg   MCHC 33.6 32.0 - 36.0 g/dL   RDW 13.9 11.0 - 15.0 %   Platelets 231 140 - 400 K/uL   MPV 12.4 7.5 - 12.5 fL   Neutro Abs 3,572 1,500 - 7,800 cells/uL   Lymphs Abs 2,660 850 - 3,900 cells/uL   Monocytes Absolute 988 (H) 200 - 950 cells/uL   Eosinophils Absolute 380 15 - 500 cells/uL    Basophils Absolute 0 0 - 200 cells/uL   Neutrophils Relative % 47 %   Lymphocytes Relative 35 %   Monocytes Relative 13 %   Eosinophils Relative 5 %   Basophils Relative 0 %   Smear Review Criteria for review not met   COMPLETE METABOLIC PANEL WITH GFR     Status: None   Collection Time: 06/22/16 11:39 AM  Result Value Ref Range   Sodium 139 135 - 146 mmol/L   Potassium 4.1 3.5 - 5.3 mmol/L   Chloride 107 98 - 110 mmol/L   CO2 22 20 - 31 mmol/L   Glucose, Bld 93 65 - 99 mg/dL   BUN 14 7 - 25 mg/dL   Creat 0.86 0.70 - 1.33 mg/dL    Comment:     Total Bilirubin 0.7 0.2 - 1.2 mg/dL   Alkaline Phosphatase 43 40 - 115 U/L   AST 17 10 - 35 U/L   ALT 17 9 - 46 U/L   Total Protein 6.7 6.1 - 8.1 g/dL   Albumin 4.5 3.6 - 5.1 g/dL   Calcium 9.4 8.6 - 10.3 mg/dL   GFR, Est African American >89 >=60 mL/min   GFR, Est Non African American >89 >=60 mL/min  Lipid panel     Status: Abnormal   Collection Time: 06/22/16 11:39 AM  Result Value Ref Range   Cholesterol 205 (H) <200 mg/dL   Triglycerides 281 (H) <150 mg/dL   HDL 37 (L) >40 mg/dL   Total CHOL/HDL Ratio 5.5 (H) <5.0 Ratio   VLDL 56 (H) <30 mg/dL   LDL Cholesterol 112 (H) <100 mg/dL  TSH     Status: None   Collection Time: 06/22/16 11:39 AM  Result Value Ref Range   TSH 0.80 0.40 - 4.50 mIU/L  VITAMIN D 25 Hydroxy (Vit-D Deficiency, Fractures)     Status: Abnormal   Collection Time: 06/22/16 11:39 AM  Result Value Ref Range   Vit D, 25-Hydroxy 20 (L) 30 - 100 ng/mL    Comment:   HIV antibody     Status: None   Collection Time: 06/22/16 11:39 AM  Result Value Ref Range   HIV 1&2 Ab, 4th Generation NONREACTIVE NONREACTIVE    Comment:     Hepatitis C antibody     Status: None   Collection Time: 06/22/16 11:39 AM  Result Value Ref Range   HCV Ab NEGATIVE NEGATIVE    EKG interpretation: Rhythm: sinus No ST/T changes concerning for acute ischemia/infarct     ASSESSMENT/PLAN:   Hypertension, unspecified type -  Hopefully controlling hypertension will resolve other symptoms. Medication initiated, follow-up 2 weeks, labs as ordered - Plan: chlorthalidone (HYGROTON) 25 MG tablet, CBC with Differential/Platelet, COMPLETE METABOLIC PANEL WITH GFR, Lipid panel, TSH  Dizziness - Plan: EKG  Other fatigue - Plan: CBC with Differential/Platelet, COMPLETE METABOLIC PANEL WITH GFR, Lipid panel, TSH, VITAMIN D 25 Hydroxy (Vit-D Deficiency, Fractures)  Encounter for hepatitis C screening test for low risk patient - Plan: Hepatitis C antibody  Encounter for screening for HIV -  Plan: HIV antibody  Numbness and tingling of both lower extremities - Plan: Vitamin B12  Peyronie disease - Plan: Ambulatory referral to Urology    Follow-up plan: Return in about 2 weeks (around 07/06/2016), or sooner if symptoms worsen or fail to improve, for BP RECHECK AND FOLLOW-UP OTHER SYMPTOMS (OV30).  Visit summary with medication list and pertinent instructions was printed for patient to review, alert [place] if any changes needed. All questions at time of visit were answered - patient instructed to contact office with any additional concerns. ER/RTC precautions were reviewed with the patient and understanding verbalized.

## 2016-06-23 LAB — HEPATITIS C ANTIBODY: HCV Ab: NEGATIVE

## 2016-06-23 LAB — VITAMIN D 25 HYDROXY (VIT D DEFICIENCY, FRACTURES): VIT D 25 HYDROXY: 20 ng/mL — AB (ref 30–100)

## 2016-06-23 LAB — HIV ANTIBODY (ROUTINE TESTING W REFLEX): HIV 1&2 Ab, 4th Generation: NONREACTIVE

## 2016-07-09 ENCOUNTER — Encounter: Payer: Self-pay | Admitting: Osteopathic Medicine

## 2016-07-09 ENCOUNTER — Ambulatory Visit (INDEPENDENT_AMBULATORY_CARE_PROVIDER_SITE_OTHER): Payer: BLUE CROSS/BLUE SHIELD | Admitting: Osteopathic Medicine

## 2016-07-09 VITALS — BP 151/96 | HR 84 | Ht 67.0 in | Wt 194.0 lb

## 2016-07-09 DIAGNOSIS — E559 Vitamin D deficiency, unspecified: Secondary | ICD-10-CM

## 2016-07-09 DIAGNOSIS — I10 Essential (primary) hypertension: Secondary | ICD-10-CM

## 2016-07-09 MED ORDER — CHLORTHALIDONE 50 MG PO TABS: 50.0000 mg | ORAL_TABLET | Freq: Every day | ORAL | 1 refills | Status: DC

## 2016-07-09 NOTE — Patient Instructions (Signed)
[facility]: [phone]

## 2016-07-09 NOTE — Progress Notes (Signed)
HPI: Brandon Conrad is a 61 y.o. male  who presents to [facility] today, 07/09/16,  for chief complaint of:  Chief Complaint  Patient presents with  . Follow-up    BLOOD PRESSURE    Hypertension and associated symptoms - recently restarted medications We had initially switch from ACE inhibitor to beta blocker due to concern for rash from Ace and anxiety issues hopefully would be helped by beta blocker medication. Patient had been out of blood pressure medications for a few months and BP elevated last visit 07/02/2016 at 162/93. Improved today but not at goal.   With having other symptoms of lightheadedness, headache, some vertigo/fatigue. Patient states that since he has started eating better and taking vitamin D supplements all these additional symptoms have resolved.    Past medical history, surgical history, social history and family history reviewed.  Patient Active Problem List   Diagnosis Date Noted  . Anxiety state 03/28/2015  . Palpitations 02/18/2015  . Essential hypertension 02/18/2015  . Circadian rhythm disorder 02/18/2015  . History of episode of anxiety 02/18/2015  . Encounter for tobacco use cessation counseling 02/18/2015    Current medication list and allergy/intolerance information reviewed.   Current Outpatient Prescriptions on File Prior to Visit  Medication Sig Dispense Refill  . chlorthalidone (HYGROTON) 25 MG tablet Take 1 tablet (25 mg total) by mouth daily. 30 tablet 1   No current facility-administered medications on file prior to visit.    Allergies  Allergen Reactions  . Bee Venom   . Cephalosporins       Review of Systems:  Constitutional: No recent illness, +fatigue - improving  HEENT: No  headache, no vision change  Cardiac: No  chest pain, No  pressure, No palpitations  Respiratory:  No  shortness of breath. No  Cough   Exam:  BP (!) 151/96   Pulse 84   Ht 5\' 7"  (1.702 m)   Wt 194 lb (88 kg)   BMI  30.38 kg/m   Constitutional: VS see above. General Appearance: alert, well-developed, well-nourished, NAD  Eyes: Normal lids and conjunctive, non-icteric sclera  Ears, Nose, Mouth, Throat: MMM, Normal external inspection ears/nares/mouth/lips/gums.  Neck: No masses, trachea midline.   Respiratory: Normal respiratory effort. no wheeze, no rhonchi, no rales  Cardiovascular: S1/S2 normal, no murmur, no rub/gallop auscultated. RRR.   Musculoskeletal: Gait normal. Symmetric and independent movement of all extremities  Neurological: Normal balance/coordination. No tremor.  Skin: warm, dry, intact.   Psychiatric: Normal judgment/insight. Normal mood and affect. Oriented x3.    ASSESSMENT/PLAN: Increased his chlorthalidone, plan to recheck BMP in the next 4 weeks, will have been on therapy for 6 weeks about point.  Hypertension, unspecified type - Plan: chlorthalidone (HYGROTON) 50 MG tablet, BASIC METABOLIC PANEL WITH GFR  Vitamin D deficiency    Follow-up plan: Return for nurse visit for BP check 2 weeks, labs in 4 weeks .  Visit summary with medication list and pertinent instructions was printed for patient to review, alert us if any changes needed. All questions at time of visit were answered - patient instructed to contact office with any additional concerns. ER/RTC precautions were reviewed with the patient and understanding verbalized.

## 2016-07-19 ENCOUNTER — Telehealth: Payer: Self-pay

## 2016-07-19 NOTE — Telephone Encounter (Signed)
This  Is just a note stating that Patrice from  [facility] called and was unable to reach patient to schedule appointment. She tried to reach out to the patient several times. Please be advised. Romone Shaff,CMA

## 2016-07-23 ENCOUNTER — Ambulatory Visit: Payer: BLUE CROSS/BLUE SHIELD

## 2016-07-24 ENCOUNTER — Ambulatory Visit (INDEPENDENT_AMBULATORY_CARE_PROVIDER_SITE_OTHER): Payer: BLUE CROSS/BLUE SHIELD | Admitting: Osteopathic Medicine

## 2016-07-24 VITALS — BP 148/89 | HR 93 | Wt 194.0 lb

## 2016-07-24 DIAGNOSIS — I10 Essential (primary) hypertension: Secondary | ICD-10-CM

## 2016-07-24 NOTE — Progress Notes (Signed)
Patient came into clinic today for BP recheck. At last OV the Pt's BP Rx was increased to chlorthalidone 50 mg. Pt reports he has been taking this every morning with no complications. Denies any dizziness, headaches, chest pain, palpitations. Pt BP today was lower, but not yet at goal. Advised I would route this to PCP for review and call him with any changes. While in office, did discuss Pt completing Cologuard. Pamphlet given to Pt so he can contact insurance company to see if it is covered. Did get Pt to sign form, but not date it. He will contact office if covered so we can fax over form.

## 2016-07-24 NOTE — Progress Notes (Signed)
BP (!) 148/89   Pulse 93   Wt 194 lb (88 kg)   BMI 30.38 kg/m  BP better than last visit but still not at goal Called patient to discuss option: increase dose of this Rx or add second agent - left voicemail to call clinic back. 07/24/16 4:48 PM  Called 07/26/16 5:16 PM - pt will try second medication, he has phone # for urologist and will call to schedule an appointment. RTC here in 2 weeks for BP recheck

## 2016-07-25 ENCOUNTER — Telehealth: Payer: Self-pay

## 2016-07-25 NOTE — Telephone Encounter (Signed)
Patient left a vm that he was returning provider call that he missed. He request a call back. Darold Miley,CMA

## 2016-07-26 MED ORDER — LOSARTAN POTASSIUM 25 MG PO TABS: 25.0000 mg | ORAL_TABLET | Freq: Every day | ORAL | 1 refills | Status: DC

## 2016-07-31 DIAGNOSIS — N486 Induration penis plastica: Secondary | ICD-10-CM | POA: Diagnosis not present

## 2016-08-02 NOTE — Addendum Note (Signed)
Addended by: Collie SiadICHARDSON, Royann Wildasin M on: 08/02/2016 11:01 AM   Modules accepted: Orders

## 2016-08-08 ENCOUNTER — Ambulatory Visit: Payer: BLUE CROSS/BLUE SHIELD | Admitting: Physician Assistant

## 2016-08-08 ENCOUNTER — Ambulatory Visit (INDEPENDENT_AMBULATORY_CARE_PROVIDER_SITE_OTHER): Payer: BLUE CROSS/BLUE SHIELD | Admitting: Osteopathic Medicine

## 2016-08-08 VITALS — BP 130/88 | HR 91 | Ht 67.0 in | Wt 195.7 lb

## 2016-08-08 DIAGNOSIS — N486 Induration penis plastica: Secondary | ICD-10-CM

## 2016-08-08 DIAGNOSIS — I10 Essential (primary) hypertension: Secondary | ICD-10-CM

## 2016-08-08 DIAGNOSIS — B079 Viral wart, unspecified: Secondary | ICD-10-CM | POA: Diagnosis not present

## 2016-08-08 MED ORDER — CHLORTHALIDONE 50 MG PO TABS: 50.0000 mg | ORAL_TABLET | Freq: Every day | ORAL | 1 refills | Status: DC

## 2016-08-08 MED ORDER — LOSARTAN POTASSIUM 25 MG PO TABS: 25.0000 mg | ORAL_TABLET | Freq: Every day | ORAL | 1 refills | Status: DC

## 2016-08-08 NOTE — Progress Notes (Signed)
HPI: Brandon Conrad is a 61 y.o. male  who presents to [facility] [place] today, 08/08/16,  for chief complaint of:  Chief Complaint  Patient presents with  . Hypertension    Hypertension: doing well on meds, no complaints. Last visit added ARB to Tzd  Urology: recently seen, no records available as of yet. Pt will get back to us with name of medication started   Skin concern: thought might be sliver, small growth on L index finger pad, wart on chin    Past medical history, surgical history, social history and family history reviewed.  Patient Active Problem List   Diagnosis Date Noted  . Anxiety state 03/28/2015  . Palpitations 02/18/2015  . Essential hypertension 02/18/2015  . Circadian rhythm disorder 02/18/2015  . History of episode of anxiety 02/18/2015  . Encounter for tobacco use cessation counseling 02/18/2015    Current medication list and allergy/intolerance information reviewed.   Current Outpatient Prescriptions on File Prior to Visit  Medication Sig Dispense Refill  . chlorthalidone (HYGROTON) 50 MG tablet Take 1 tablet (50 mg total) by mouth daily. 30 tablet 1  . losartan (COZAAR) 25 MG tablet Take 1 tablet (25 mg total) by mouth daily. 30 tablet 1   No current facility-administered medications on file prior to visit.    Allergies  Allergen Reactions  . Bee Venom   . Cephalosporins       Review of Systems:  Constitutional: No recent illness, +fatigue - improving  HEENT: No  headache, no vision change  Cardiac: No  chest pain, No  pressure, No palpitations  Respiratory:  No  shortness of breath. No  Cough   Exam:  BP 130/88   Pulse 91   Ht 5\' 7"  (1.702 m)   Wt 195 lb 11.2 oz (88.8 kg)   BMI 30.65 kg/m   Constitutional: VS see above. General Appearance: alert, well-developed, well-nourished, NAD  Eyes: Normal lids and conjunctive, non-icteric sclera  Ears, Nose, Mouth, Throat: MMM, Normal external inspection  ears/nares/mouth/lips/gums.  Neck: No masses, trachea midline.   Respiratory: Normal respiratory effort. no wheeze, no rhonchi, no rales  Cardiovascular: S1/S2 normal, no murmur, no rub/gallop auscultated. RRR.   Musculoskeletal: Gait normal. Symmetric and independent movement of all extremities  Neurological: Normal balance/coordination. No tremor.  Skin: warm, dry, intact. Small warty growth on R index finger, no FB noted. Small wart on chin.   Psychiatric: Normal judgment/insight. Normal mood and affect. Oriented x3.    ASSESSMENT/PLAN:   Hypertension, unspecified type - BP improved, continue current meds for 6 mos and recheck labs today  - Plan: losartan (COZAAR) 25 MG tablet, chlorthalidone (HYGROTON) 50 MG tablet  Peyronie disease - following with urology - call us w/ name Rx  Viral warts, unspecified type - OTC meds recommended, offered cryo but pt declines today, will recheck as needed    Follow-up plan: Return in about 6 months (around 02/08/2017) for blood pressure followup .  Visit summary with medication list and pertinent instructions was printed for patient to review, alert us if any changes needed. All questions at time of visit were answered - patient instructed to contact office with any additional concerns. ER/RTC precautions were reviewed with the patient and understanding verbalized.

## 2016-08-09 ENCOUNTER — Other Ambulatory Visit: Payer: Self-pay | Admitting: Osteopathic Medicine

## 2016-08-09 DIAGNOSIS — E876 Hypokalemia: Secondary | ICD-10-CM

## 2016-08-09 LAB — BASIC METABOLIC PANEL WITH GFR
BUN: 17 mg/dL (ref 7–25)
CHLORIDE: 92 mmol/L — AB (ref 98–110)
CO2: 32 mmol/L — AB (ref 20–31)
Calcium: 9.2 mg/dL (ref 8.6–10.3)
Creat: 0.92 mg/dL (ref 0.70–1.33)
GFR, Est African American: >89 mL/min (ref >=60)
GFR, Est Non African American: >89 mL/min (ref >=60)
GLUCOSE: 139 mg/dL — AB (ref 65–99)
POTASSIUM: 3 mmol/L — AB (ref 3.5–5.3)
Sodium: 137 mmol/L (ref 135–146)

## 2016-08-09 MED ORDER — POTASSIUM CHLORIDE CRYS ER 20 MEQ PO TBCR: 20.0000 meq | EXTENDED_RELEASE_TABLET | Freq: Every day | ORAL | 0 refills | Status: DC

## 2016-08-09 NOTE — Progress Notes (Signed)
Left voicemail see lab result note

## 2016-08-14 ENCOUNTER — Other Ambulatory Visit: Payer: Self-pay

## 2016-08-14 ENCOUNTER — Ambulatory Visit (INDEPENDENT_AMBULATORY_CARE_PROVIDER_SITE_OTHER): Payer: BLUE CROSS/BLUE SHIELD | Admitting: Osteopathic Medicine

## 2016-08-14 VITALS — BP 139/90 | HR 85 | Ht 67.0 in | Wt 202.6 lb

## 2016-08-14 DIAGNOSIS — I10 Essential (primary) hypertension: Secondary | ICD-10-CM

## 2016-08-14 DIAGNOSIS — E876 Hypokalemia: Secondary | ICD-10-CM | POA: Diagnosis not present

## 2016-08-14 DIAGNOSIS — T502X5A Adverse effect of carbonic-anhydrase inhibitors, benzothiadiazides and other diuretics, initial encounter: Secondary | ICD-10-CM

## 2016-08-14 DIAGNOSIS — R42 Dizziness and giddiness: Secondary | ICD-10-CM

## 2016-08-14 NOTE — Progress Notes (Signed)
   Subjective:    Patient ID: Brandon Conrad, male    DOB: [birth year], 61 y.o.   MRN: [id number]  HPI Pt is here for a BP check. Pt denies chest pain, shortness of breath, palpitations, headaches, or any problems with medication.    Review of Systems     Objective:   Physical Exam        Assessment & Plan:  Pt is getting labs done today.

## 2016-08-15 ENCOUNTER — Other Ambulatory Visit: Payer: Self-pay | Admitting: Osteopathic Medicine

## 2016-08-15 DIAGNOSIS — I10 Essential (primary) hypertension: Secondary | ICD-10-CM

## 2016-08-15 LAB — BASIC METABOLIC PANEL WITH GFR
BUN: 12 mg/dL (ref 7–25)
CALCIUM: 9.3 mg/dL (ref 8.6–10.3)
CO2: 28 mmol/L (ref 20–31)
CREATININE: 0.84 mg/dL (ref 0.70–1.33)
Chloride: 100 mmol/L (ref 98–110)
GLUCOSE: 106 mg/dL — AB (ref 65–99)
Potassium: 3.6 mmol/L (ref 3.5–5.3)
Sodium: 137 mmol/L (ref 135–146)

## 2016-08-15 MED ORDER — LOSARTAN POTASSIUM 100 MG PO TABS: 100.0000 mg | ORAL_TABLET | Freq: Every day | ORAL | 1 refills | Status: DC

## 2016-08-15 NOTE — Progress Notes (Signed)
S: Patient feeling well. No chest pain, pressure, shortness of breath. Patient has discontinued the chlorthalidone and doubled up on the losartan. Blood pressure a bit above goal but better than it was.      O: BP 139/90   Pulse 85   Ht  (1.702 m)   Wt 202 lb 9.6 oz (91.9 kg)   SpO2 100%   BMI 31.73 kg/m   General: Alert, NAD No results found for this or any previous visit (from the past 24 hour(s)). as of 08/15/16   A/P:  Essential hypertension - not at goal, needed to d/c chlorthalidone d/t hypokalemia  Diuretic-induced hypokalemia - Due for labs - if potassium normal, can discontinue supplementation and may need to consider increasing losartan to 100 mg.

## 2016-08-15 NOTE — Progress Notes (Signed)
K back to normal Stop K D/C chlorthalidone Increase Losartan Nurse visit 2 weeks recheck BP

## 2016-08-31 ENCOUNTER — Ambulatory Visit (INDEPENDENT_AMBULATORY_CARE_PROVIDER_SITE_OTHER): Payer: BLUE CROSS/BLUE SHIELD | Admitting: Physician Assistant

## 2016-08-31 ENCOUNTER — Telehealth: Payer: Self-pay | Admitting: *Deleted

## 2016-08-31 DIAGNOSIS — I10 Essential (primary) hypertension: Secondary | ICD-10-CM

## 2016-08-31 MED ORDER — LOSARTAN POTASSIUM 100 MG PO TABS: 100.0000 mg | ORAL_TABLET | Freq: Every day | ORAL | 2 refills | Status: DC

## 2016-08-31 NOTE — Progress Notes (Signed)
Blood pressure is noted to be borderline elevated today upon initial check. Patient confirms taking Losartan 100 mg once a day consistently. Patient denies headaches, fatigue,heart palpitations,dizziness.  Allowed the patient to sit for 5 min before rechecking blood pressure. BP did come down.(see 2nd reading)  BP in normal range. I will copy to PCP. Will send refill for 3 months and to follow up with PCP. Tandy Gaw PA-C

## 2016-08-31 NOTE — Telephone Encounter (Signed)
Called and left message on patient's vm to with providers instructions in re today's nurse visit bp check

## 2016-09-03 NOTE — Progress Notes (Signed)
BP 134/87   Pulse 73   Wt 200 lb (90.7 kg)   BMI 31.32 kg/m  Noted, thanks.

## 2016-11-02 DIAGNOSIS — N486 Induration penis plastica: Secondary | ICD-10-CM | POA: Diagnosis not present

## 2017-02-07 ENCOUNTER — Ambulatory Visit: Payer: BLUE CROSS/BLUE SHIELD | Admitting: Osteopathic Medicine

## 2017-02-07 DIAGNOSIS — Z0189 Encounter for other specified special examinations: Secondary | ICD-10-CM

## 2017-03-18 ENCOUNTER — Other Ambulatory Visit: Payer: Self-pay | Admitting: Osteopathic Medicine

## 2017-03-18 DIAGNOSIS — I10 Essential (primary) hypertension: Secondary | ICD-10-CM

## 2017-04-15 ENCOUNTER — Telehealth: Payer: Self-pay | Admitting: Osteopathic Medicine

## 2017-04-15 NOTE — Telephone Encounter (Signed)
I called pt and left a message for them to call our office and schedule a F/u appt with Dr.Alexander for BP

## 2017-09-11 ENCOUNTER — Ambulatory Visit (INDEPENDENT_AMBULATORY_CARE_PROVIDER_SITE_OTHER): Payer: BLUE CROSS/BLUE SHIELD

## 2017-09-11 ENCOUNTER — Ambulatory Visit (INDEPENDENT_AMBULATORY_CARE_PROVIDER_SITE_OTHER): Payer: BLUE CROSS/BLUE SHIELD | Admitting: Osteopathic Medicine

## 2017-09-11 ENCOUNTER — Encounter: Payer: Self-pay | Admitting: Osteopathic Medicine

## 2017-09-11 VITALS — BP 155/103 | HR 86 | Temp 98.2°F | Wt 200.5 lb

## 2017-09-11 DIAGNOSIS — Z23 Encounter for immunization: Secondary | ICD-10-CM | POA: Diagnosis not present

## 2017-09-11 DIAGNOSIS — I10 Essential (primary) hypertension: Secondary | ICD-10-CM

## 2017-09-11 DIAGNOSIS — M545 Low back pain, unspecified: Secondary | ICD-10-CM

## 2017-09-11 DIAGNOSIS — M25552 Pain in left hip: Secondary | ICD-10-CM | POA: Diagnosis not present

## 2017-09-11 DIAGNOSIS — M47816 Spondylosis without myelopathy or radiculopathy, lumbar region: Secondary | ICD-10-CM | POA: Diagnosis not present

## 2017-09-11 MED ORDER — NAPROXEN 500 MG PO TABS: 500.0000 mg | ORAL_TABLET | Freq: Two times a day (BID) | ORAL | 1 refills | Status: DC

## 2017-09-11 MED ORDER — PREDNISONE 20 MG PO TABS: 20.0000 mg | ORAL_TABLET | Freq: Two times a day (BID) | ORAL | 0 refills | Status: DC

## 2017-09-11 MED ORDER — CYCLOBENZAPRINE HCL 10 MG PO TABS: 5.0000 mg | ORAL_TABLET | Freq: Three times a day (TID) | ORAL | 1 refills | Status: DC | PRN

## 2017-09-11 MED ORDER — LOSARTAN POTASSIUM 100 MG PO TABS: 100.0000 mg | ORAL_TABLET | Freq: Every day | ORAL | 3 refills | Status: DC

## 2017-09-11 NOTE — Progress Notes (Signed)
HPI: Brandon Conrad is a 62 y.o. male who  has a past medical history of Anxiety and Hypertension.  he presents to [facility] today, 09/11/17,  for chief complaint of:  Back pain HTN - off meds  Back pain . Context: twisted it while golfing . Location: L lower back and hip/groin  . Quality: sharp, throbbing  . Duration: 3 weeks . Worse with bending, lifting, twisting  . Ibuprofen helps for a short while.     HTN: Was working on natural BP remedies, some concern for losartan connection with cancer risk, there have been recent medication recalls but as far as he knows his was not affected.      Past medical history, surgical history, and family history reviewed.  Current medication list and allergy/intolerance information reviewed.   (See remainder of HPI, as well as ROS, PE below)    ASSESSMENT/PLAN:   Acute left-sided low back pain without sciatica - hip arthritis complication seems likely, consider DDD or bulging disc - MRI if PT not helpful or if symptoms change/worsen  - Plan: DG Lumbar Spine 2-3 Views, DG HIP UNILAT W OR W/O PELVIS 2-3 VIEWS LEFT, Ambulatory referral to Physical Therapy  Hypertension, unspecified type - Get back on meds!  - Plan: losartan (COZAAR) 100 MG tablet  Need for Tdap vaccination - Plan: Tdap vaccine greater than or equal to 7yo IM     Meds ordered this encounter  Medications  . naproxen (NAPROSYN) 500 MG tablet    Sig: Take 1 tablet (500 mg total) by mouth 2 (two) times daily with a meal. Take every day for one week, then use as needed after that    Dispense:  60 tablet    Refill:  1  . predniSONE (DELTASONE) 20 MG tablet    Sig: Take 1 tablet (20 mg total) by mouth 2 (two) times daily with a meal.    Dispense:  10 tablet    Refill:  0  . cyclobenzaprine (FLEXERIL) 10 MG tablet    Sig: Take 0.5-1 tablets (5-10 mg total) by mouth 3 (three) times daily as needed for muscle spasms. Caution: can cause  drowsiness    Dispense:  60 tablet    Refill:  1  . losartan (COZAAR) 100 MG tablet    Sig: Take 1 tablet (100 mg total) by mouth daily.    Dispense:  90 tablet    Refill:  3    Please consider 90 day supplies to promote better adherence    Patient Instructions  If back/hip pain is not better or if it gets worse, I would recommend follow-up with one of our sports medicine specialists (Dr Denyse Amass or Dr. Cherylann Parr Dr. Karie Schwalbe) for further evaluation in 2-4 weeks. Just call our office and ask for an appointment for sports medicine!     Follow-up plan: Return if symptoms worsen or fail to improve.     ############################### ############################### ###############################   Allergies  Allergen Reactions  . Bee Venom   . Cephalosporins       Review of Systems:  Constitutional: No recent illness  HEENT: No  headache, no vision change  Cardiac: No  chest pain, No  pressure, No palpitations  Respiratory:  No  shortness of breath. No  Cough  Gastrointestinal: No  abdominal pain, no change on bowel habits  Musculoskeletal: +new myalgia/arthralgia  Skin: No  Rash  Hem/Onc: No  easy bruising/bleeding, No  abnormal lumps/bumps  Neurologic: No  weakness, No  Dizziness  Psychiatric: No  concerns with depression, No  concerns with anxiety  Exam:  BP (!) 155/103 (BP Location: Left Arm, Patient Position: Sitting, Cuff Size: Normal)   Pulse 86   Temp 98.2 F (36.8 C) (Oral)   Wt 200 lb 8 oz (90.9 kg)   BMI 31.40 kg/m   Constitutional: VS see above. General Appearance: alert, well-developed, well-nourished, NAD  Eyes: Normal lids and conjunctive, non-icteric sclera  Ears, Nose, Mouth, Throat: MMM, Normal external inspection ears/nares/mouth/lips/gums.  Neck: No masses, trachea midline.   Respiratory: Normal respiratory effort. no wheeze, no rhonchi, no rales  Cardiovascular: S1/S2 normal, no murmur, no rub/gallop auscultated. RRR.    Musculoskeletal: Gait normal. (+)pain w/ SLR on L, log roll on L, paraspinal tenderness lower lumbar spine, quadratus lumborum area.   Neurological: Normal balance/coordination. No tremor.  Skin: warm, dry, intact.   Psychiatric: Normal judgment/insight. Normal mood and affect. Oriented x3.   Visit summary with medication list and pertinent instructions was printed for patient to review, alert [place] if any changes needed. All questions at time of visit were answered - patient instructed to contact office with any additional concerns. ER/RTC precautions were reviewed with the patient and understanding verbalized.   Follow-up plan: Return if symptoms worsen or fail to improve.    Please note: voice recognition software was used to produce this document, and typos may escape review. Please contact Dr. Lyn Hollingshead for any needed clarifications.

## 2017-09-11 NOTE — Patient Instructions (Signed)
If back/hip pain is not better or if it gets worse, I would recommend follow-up with one of our sports medicine specialists (Dr Denyse Amass or Dr. Cherylann Parr Dr. Karie Schwalbe) for further evaluation in 2-4 weeks. Just call our office and ask for an appointment for sports medicine!

## 2017-09-19 ENCOUNTER — Ambulatory Visit: Payer: BLUE CROSS/BLUE SHIELD | Admitting: Rehabilitative and Restorative Service Providers"

## 2017-11-07 DIAGNOSIS — F17200 Nicotine dependence, unspecified, uncomplicated: Secondary | ICD-10-CM | POA: Diagnosis not present

## 2017-11-07 DIAGNOSIS — R200 Anesthesia of skin: Secondary | ICD-10-CM | POA: Diagnosis not present

## 2017-11-07 DIAGNOSIS — I10 Essential (primary) hypertension: Secondary | ICD-10-CM | POA: Diagnosis not present

## 2017-11-07 DIAGNOSIS — R002 Palpitations: Secondary | ICD-10-CM | POA: Diagnosis not present

## 2017-11-07 DIAGNOSIS — R42 Dizziness and giddiness: Secondary | ICD-10-CM | POA: Diagnosis not present

## 2017-11-07 DIAGNOSIS — E876 Hypokalemia: Secondary | ICD-10-CM | POA: Diagnosis not present

## 2018-05-19 ENCOUNTER — Encounter: Payer: Self-pay | Admitting: Osteopathic Medicine

## 2018-05-19 ENCOUNTER — Ambulatory Visit: Payer: 59 | Admitting: Osteopathic Medicine

## 2018-05-19 VITALS — BP 154/104 | HR 80 | Temp 98.2°F | Wt 205.4 lb

## 2018-05-19 DIAGNOSIS — R0982 Postnasal drip: Secondary | ICD-10-CM | POA: Diagnosis not present

## 2018-05-19 DIAGNOSIS — I10 Essential (primary) hypertension: Secondary | ICD-10-CM | POA: Diagnosis not present

## 2018-05-19 DIAGNOSIS — L989 Disorder of the skin and subcutaneous tissue, unspecified: Secondary | ICD-10-CM | POA: Diagnosis not present

## 2018-05-19 DIAGNOSIS — R195 Other fecal abnormalities: Secondary | ICD-10-CM

## 2018-05-19 DIAGNOSIS — R748 Abnormal levels of other serum enzymes: Secondary | ICD-10-CM

## 2018-05-19 DIAGNOSIS — K117 Disturbances of salivary secretion: Secondary | ICD-10-CM | POA: Diagnosis not present

## 2018-05-19 MED ORDER — HYDROCHLOROTHIAZIDE 25 MG PO TABS: 25.0000 mg | ORAL_TABLET | Freq: Every day | ORAL | 1 refills | Status: DC

## 2018-05-19 MED ORDER — FLUTICASONE PROPIONATE 50 MCG/ACT NA SUSP: 2.0000 | Freq: Every day | NASAL | 6 refills | Status: DC

## 2018-05-19 MED ORDER — HYDROCHLOROTHIAZIDE 25 MG PO TABS: 25.0000 mg | ORAL_TABLET | Freq: Every day | ORAL | 0 refills | Status: DC

## 2018-05-19 NOTE — Patient Instructions (Signed)
Blood pressure: Will add hydrochlorothiazide 25 mg blood pressure medication  If working, will see if we can get the combination pill  Skin: Referral in place to dermatology Call us if you can't get in with the specialist in the next month  Salivation/Sinus: Trial fluticasone nasal spray for 2+ weeks, Rx sent or can take OTC Flonase If not better, or if worse, will send to ENT especially given smoking history, they may take a look down the throat to make sure no problems   Gastrointestinal: Will get labs today Consider stool analysis - call me if mucusy stool persists and I'll place orders May need to consider colonoscopy

## 2018-05-19 NOTE — Progress Notes (Signed)
HPI: Brandon Conrad is a 63 y.o. male who  has a past medical history of Anxiety and Hypertension.  he presents to [facility] today, 05/19/18,  for chief complaint of: Multiple concerns: see headings  Skin:  dry patch on back, no visible rash. Feels like "nail polish or something, like it's dried in a patch on the back, and like there's a sliver in it."  See photos   GI/ENT: Feels like he is over salivating, noticing a lot of associated nasal drainage. No painful swallowing but feels like throat is a bit sore/dry. Former smoker, quit 2 weeks ago!   GI bowel movement issue:  Concern for mucus stool. Seems to be getting better. Reports feeling like a relatively large stool burden when he evacuates, but no constipation or diarrhea, no clay-colored stool.    HTN: BP elevated to 162/97 on intake, on recheck was not better at 154/104. No CP/SOB, is taking meds as directed, Losartan 100 mg daily.   BP Readings from Last 3 Encounters:  05/19/18 (!) 162/97  09/11/17 (!) 155/103  08/31/16 134/87       Past medical, surgical, social and family history reviewed:  Patient Active Problem List   Diagnosis Date Noted  . Anxiety state 03/28/2015  . Palpitations 02/18/2015  . Essential hypertension 02/18/2015  . Circadian rhythm disorder 02/18/2015  . History of episode of anxiety 02/18/2015  . Encounter for tobacco use cessation counseling 02/18/2015    Past Surgical History:  Procedure Laterality Date  . APPENDECTOMY  2011    Social History   Tobacco Use  . Smoking status: Former Smoker    Packs/day: 0.25    Types: Cigarettes    Last attempt to quit: 02/16/2015    Years since quitting: 3.2  . Smokeless tobacco: Never Used  Substance Use Topics  . Alcohol use: Yes    Alcohol/week: 12.0 standard drinks    Types: 12 Standard drinks or equivalent per week    Comment: 1-2 beers q day    No family history on file.   Current medication list and  allergy/intolerance information reviewed:    Current Outpatient Medications  Medication Sig Dispense Refill  . cyclobenzaprine (FLEXERIL) 10 MG tablet Take 0.5-1 tablets (5-10 mg total) by mouth 3 (three) times daily as needed for muscle spasms. Caution: can cause drowsiness 60 tablet 1  . losartan (COZAAR) 100 MG tablet Take 1 tablet (100 mg total) by mouth daily. 90 tablet 3  . naproxen (NAPROSYN) 500 MG tablet Take 1 tablet (500 mg total) by mouth 2 (two) times daily with a meal. Take every day for one week, then use as needed after that (Patient not taking: Reported on 05/19/2018) 60 tablet 1  . predniSONE (DELTASONE) 20 MG tablet Take 1 tablet (20 mg total) by mouth 2 (two) times daily with a meal. (Patient not taking: Reported on 05/19/2018) 10 tablet 0   No current facility-administered medications for this visit.     Allergies  Allergen Reactions  . Bee Venom   . Cephalosporins       Review of Systems:  Constitutional:  No  fever, no chills, No recent illness, No unintentional weight changes. No significant fatigue.   HEENT: No  headache, no vision change, no hearing change, +dry throat, +sinus drainage - see HPI  Cardiac: No  chest pain, No  pressure, No palpitations, No  Orthopnea  Respiratory:  No  shortness of breath. No  Cough  Gastrointestinal: No  abdominal pain, No  nausea, No  vomiting,  No  blood in stool, No  diarrhea, No  constipation - see HPI  Musculoskeletal: No new myalgia/arthralgia  Skin: No  Rash, No other wounds/concerning lesions except as per HPI  Hem/Onc: No  easy bruising/bleeding, No  abnormal lymph node or lump/bump  Neurologic: No  weakness, No  dizziness  Psychiatric: No  concerns with depression, No  concerns with anxiety  Exam:  BP (!) 154/104 (BP Location: Left Arm, Patient Position: Sitting, Cuff Size: Normal)   Pulse 80   Temp 98.2 F (36.8 C) (Oral)   Wt 205 lb 6.4 oz (93.2 kg)   BMI 32.17 kg/m   Constitutional: VS see above.  General Appearance: alert, well-developed, well-nourished, NAD  Eyes: Normal lids and conjunctive, non-icteric sclera  Ears, Nose, Mouth, Throat: MMM, Normal external inspection ears/nares/mouth/lips/gums. TM scarred bilaterally. Pharynx/tonsils no erythema, no exudate. Nasal mucosa normal. Parotids appear normal and are not enlarged/tender on palpation.   Neck: No masses, trachea midline. No thyroid enlargement. No tenderness/mass appreciated. No lymphadenopathy  Respiratory: Normal respiratory effort. no wheeze, no rhonchi, no rales  Cardiovascular: S1/S2 normal, no murmur, no rub/gallop auscultated. RRR. No lower extremity edema.   Gastrointestinal: Nontender, no masses. No hepatomegaly, no splenomegaly. No hernia appreciated. Bowel sounds normal. Rectal exam deferred.   Musculoskeletal: Gait normal. No clubbing/cyanosis of digits.   Neurological: Normal balance/coordination. No tremor.   Skin: warm, dry, intact.Questionable nevus on mid/L back about 1 cm diameter see photos. This is not in the area where he feels "dryness/sliver" - that spot is more to the left and inferior   Psychiatric: Normal judgment/insight. Normal mood and affect. Oriented x3.                 ASSESSMENT/PLAN: The primary encounter diagnosis was Hypertension, unspecified type. Diagnoses of Skin problem, Excessive salivation, Postnasal drip, and Abnormal stools were also pertinent to this visit.   Orders Placed This Encounter  Procedures  . CBC with Differential/Platelet  . COMPLETE METABOLIC PANEL WITH GFR  . Lipid panel  . TSH  . Lipase  . Gamma GT  . Ambulatory referral to Dermatology    Meds ordered this encounter  Medications  . hydrochlorothiazide (HYDRODIURIL) 25 MG tablet    Sig: Take 1 tablet (25 mg total) by mouth daily.    Dispense:  90 tablet    Refill:  1  . fluticasone (FLONASE) 50 MCG/ACT nasal spray    Sig: Place 2 sprays into both nostrils daily.    Dispense:  16 g     Refill:  6    Patient Instructions  Blood pressure: Will add hydrochlorothiazide 25 mg blood pressure medication  If working, will see if we can get the combination pill  Skin: Referral in place to dermatology Call us if you can't get in with the specialist in the next month  Salivation/Sinus: Trial fluticasone nasal spray for 2+ weeks, Rx sent or can take OTC Flonase If not better, or if worse, will send to ENT especially given smoking history, they may take a look down the throat to make sure no problems   Gastrointestinal: Will get labs today Consider stool analysis - call me if mucusy stool persists and I'll place orders May need to consider colonoscopy          Visit summary with medication list and pertinent instructions was printed for patient to review. All questions at time of visit were answered - patient instructed to contact  office with any additional concerns or updates. ER/RTC precautions were reviewed with the patient.    Please note: voice recognition software was used to produce this document, and typos may escape review. Please contact Dr. Lyn HollingsheadAlexander for any needed clarifications.     Follow-up plan: Return in about 2 weeks (around 06/02/2018) for RECHECK BLOOD PRESSURE AND OTHER SYMPTOMS - sooner if needed .

## 2018-05-20 LAB — COMPLETE METABOLIC PANEL WITH GFR
AG Ratio: 2.3 (calc) (ref 1.0–2.5)
ALT: 29 U/L (ref 9–46)
AST: 25 U/L (ref 10–35)
Albumin: 4.6 g/dL (ref 3.6–5.1)
Alkaline phosphatase (APISO): 53 U/L (ref 40–115)
BUN: 12 mg/dL (ref 7–25)
CALCIUM: 9.2 mg/dL (ref 8.6–10.3)
CO2: 28 mmol/L (ref 20–32)
Chloride: 103 mmol/L (ref 98–110)
Creat: 0.85 mg/dL (ref 0.70–1.25)
GFR, EST AFRICAN AMERICAN: 110 mL/min/{1.73_m2} (ref >=60)
GFR, EST NON AFRICAN AMERICAN: 95 mL/min/{1.73_m2} (ref >=60)
GLUCOSE: 91 mg/dL (ref 65–99)
Globulin: 2 g/dL (calc) (ref 1.9–3.7)
Potassium: 4.7 mmol/L (ref 3.5–5.3)
Sodium: 137 mmol/L (ref 135–146)
TOTAL PROTEIN: 6.6 g/dL (ref 6.1–8.1)
Total Bilirubin: 0.6 mg/dL (ref 0.2–1.2)

## 2018-05-20 LAB — LIPID PANEL
CHOL/HDL RATIO: 4.7 (calc) (ref <5.0)
Cholesterol: 222 mg/dL — ABNORMAL HIGH (ref <200)
HDL: 47 mg/dL (ref >40)
LDL CHOLESTEROL (CALC): 136 mg/dL — AB
NON-HDL CHOLESTEROL (CALC): 175 mg/dL — AB (ref <130)
TRIGLYCERIDES: 244 mg/dL — AB (ref <150)

## 2018-05-20 LAB — CBC WITH DIFFERENTIAL/PLATELET
Absolute Monocytes: 1074 cells/uL — ABNORMAL HIGH (ref 200–950)
BASOS PCT: 0.8 %
Basophils Absolute: 63 cells/uL (ref 0–200)
EOS ABS: 332 {cells}/uL (ref 15–500)
Eosinophils Relative: 4.2 %
HEMATOCRIT: 48.6 % (ref 38.5–50.0)
HEMOGLOBIN: 16.8 g/dL (ref 13.2–17.1)
Lymphs Abs: 2086 cells/uL (ref 850–3900)
MCH: 31.8 pg (ref 27.0–33.0)
MCHC: 34.6 g/dL (ref 32.0–36.0)
MCV: 92 fL (ref 80.0–100.0)
MONOS PCT: 13.6 %
MPV: 12.8 fL — AB (ref 7.5–12.5)
Neutro Abs: 4345 cells/uL (ref 1500–7800)
Neutrophils Relative %: 55 %
Platelets: 250 10*3/uL (ref 140–400)
RBC: 5.28 10*6/uL (ref 4.20–5.80)
RDW: 14.2 % (ref 11.0–15.0)
Total Lymphocyte: 26.4 %
WBC: 7.9 10*3/uL (ref 3.8–10.8)

## 2018-05-20 LAB — TSH: TSH: 1.11 m[IU]/L (ref 0.40–4.50)

## 2018-05-20 LAB — GAMMA GT: GGT: 96 U/L — AB (ref 3–70)

## 2018-05-20 LAB — LIPASE: Lipase: 23 U/L (ref 7–60)

## 2018-05-21 NOTE — Addendum Note (Signed)
Addended by: Deirdre Pippins on: 05/21/2018 05:03 PM   Modules accepted: Orders

## 2018-05-23 ENCOUNTER — Ambulatory Visit (INDEPENDENT_AMBULATORY_CARE_PROVIDER_SITE_OTHER): Payer: 59

## 2018-05-23 DIAGNOSIS — R748 Abnormal levels of other serum enzymes: Secondary | ICD-10-CM

## 2018-05-23 DIAGNOSIS — R195 Other fecal abnormalities: Secondary | ICD-10-CM

## 2018-06-02 ENCOUNTER — Encounter: Payer: Self-pay | Admitting: Osteopathic Medicine

## 2018-06-02 ENCOUNTER — Ambulatory Visit (INDEPENDENT_AMBULATORY_CARE_PROVIDER_SITE_OTHER): Payer: 59 | Admitting: Osteopathic Medicine

## 2018-06-02 VITALS — BP 143/103 | HR 98 | Temp 98.2°F | Wt 199.8 lb

## 2018-06-02 DIAGNOSIS — I10 Essential (primary) hypertension: Secondary | ICD-10-CM | POA: Diagnosis not present

## 2018-06-02 NOTE — Progress Notes (Signed)
HPI: Brandon Conrad is a 63 y.o. male who  has a past medical history of Anxiety and Hypertension.  he presents to [facility] today, 06/02/18,  for chief complaint of:  BP follow-up  We added HCTZ 25 mg to anti-HTN med regimen.  Patient states he does not feel particularly stressed out at the office but feels like his blood pressure might run high here and be normal otherwise.  We do not have any outside/home blood pressure measurements to verify this.  No chest pain, pressure, shortness of breath, headache, dizziness  BP Readings from Last 3 Encounters:  06/02/18 (!) 145/96  05/19/18 (!) 154/104  09/11/17 (!) 155/103        At today's visit... Past medical history, surgical history, and family history reviewed and updated as needed.  Current medication list and allergy/intolerance information reviewed and updated as needed. (See remainder of HPI, ROS, Phys Exam below)           ASSESSMENT/PLAN: The encounter diagnosis was Hypertension, unspecified type.       Patient Instructions   Plan to return for nurse visit to verify home blood pressure cuff. In the meantime, be keeping a record of you rblood pressures at home and bring this with you to the visit with the nurse.   If your cuff is measuring within 5-10 points of ours AND your home numbers are less than 140/90 (ideally less than 130/80) then nothing else to do.   If your home blood pressure cuff is inaccurate or is accurate but measuring above goal, we will need to talk about adjusting your medications.      Follow-up plan: Return for nurse visit - verify home BP monitor in 1-2 weeks .                             ############################################ ############################################ ############################################ ############################################    Current Meds  Medication Sig  . fluticasone  (FLONASE) 50 MCG/ACT nasal spray Place 2 sprays into both nostrils daily.  . hydrochlorothiazide (HYDRODIURIL) 25 MG tablet Take 1 tablet (25 mg total) by mouth daily.  Marland Kitchen losartan (COZAAR) 100 MG tablet Take 1 tablet (100 mg total) by mouth daily.  . naproxen (NAPROSYN) 500 MG tablet Take 1 tablet (500 mg total) by mouth 2 (two) times daily with a meal. Take every day for one week, then use as needed after that  . [DISCONTINUED] cyclobenzaprine (FLEXERIL) 10 MG tablet Take 0.5-1 tablets (5-10 mg total) by mouth 3 (three) times daily as needed for muscle spasms. Caution: can cause drowsiness  . [DISCONTINUED] predniSONE (DELTASONE) 20 MG tablet Take 1 tablet (20 mg total) by mouth 2 (two) times daily with a meal.    Allergies  Allergen Reactions  . Bee Venom   . Cephalosporins        Review of Systems:  Constitutional: No recent illness  HEENT: No  headache, no vision change  Cardiac: No  chest pain, No  pressure, No palpitations  Respiratory:  No  shortness of breath. No  Cough  Gastrointestinal: No  abdominal pain, no change on bowel habits  Musculoskeletal: No new myalgia/arthralgia   Exam:  BP (!) 143/103 (BP Location: Left Arm, Patient Position: Sitting, Cuff Size: Normal)   Pulse 98   Temp 98.2 F (36.8 C) (Oral)   Wt 199 lb 12.8 oz (90.6 kg)   BMI 31.29 kg/m   Constitutional: VS see above. General  Appearance: alert, well-developed, well-nourished, NAD  Eyes: Normal lids and conjunctive, non-icteric sclera  Ears, Nose, Mouth, Throat: MMM, Normal external inspection ears/nares/mouth/lips/gums.  Neck: No masses, trachea midline.   Respiratory: Normal respiratory effort. no wheeze, no rhonchi, no rales  Cardiovascular: S1/S2 normal, no murmur, no rub/gallop auscultated. RRR.   Musculoskeletal: Gait normal.   Neurological: Normal balance/coordination. No tremor.  Skin: warm, dry, intact.   Psychiatric: Normal judgment/insight. Normal mood and affect.  Oriented x3.       Visit summary with medication list and pertinent instructions was printed for patient to review, patient was advised to alert [place] if any updates are needed. All questions at time of visit were answered - patient instructed to contact office with any additional concerns. ER/RTC precautions were reviewed with the patient and understanding verbalized.   Note: Total time spent 15 minutes, greater than 50% of the visit was spent face-to-face counseling and coordinating care for the following: The encounter diagnosis was Hypertension, unspecified type..  Please note: voice recognition software was used to produce this document, and typos may escape review. Please contact Dr. Lyn Hollingshead for any needed clarifications.    Follow up plan: Return for nurse visit - verify home BP monitor in 1-2 weeks .

## 2018-06-02 NOTE — Patient Instructions (Signed)
Plan to return for nurse visit to verify home blood pressure cuff. In the meantime, be keeping a record of your blood pressures at home and bring this with you to the visit with the nurse.   If your cuff is measuring within 5-10 points of ours AND your home numbers are less than 140/90 (ideally less than 130/80) then nothing else to do.   If your home blood pressure cuff is inaccurate or is accurate but measuring above goal, we will need to talk about adjusting your medications.   

## 2018-08-28 ENCOUNTER — Other Ambulatory Visit: Payer: Self-pay

## 2018-08-28 MED ORDER — HYDROCHLOROTHIAZIDE 25 MG PO TABS: 25.0000 mg | ORAL_TABLET | Freq: Every day | ORAL | 0 refills | Status: DC

## 2018-09-02 ENCOUNTER — Ambulatory Visit (INDEPENDENT_AMBULATORY_CARE_PROVIDER_SITE_OTHER): Payer: 59 | Admitting: Osteopathic Medicine

## 2018-09-02 ENCOUNTER — Encounter: Payer: Self-pay | Admitting: Osteopathic Medicine

## 2018-09-02 VITALS — BP 136/93 | HR 84 | Wt 205.0 lb

## 2018-09-02 DIAGNOSIS — I10 Essential (primary) hypertension: Secondary | ICD-10-CM | POA: Diagnosis not present

## 2018-09-02 DIAGNOSIS — F411 Generalized anxiety disorder: Secondary | ICD-10-CM | POA: Diagnosis not present

## 2018-09-02 MED ORDER — VALSARTAN 320 MG PO TABS: 320.0000 mg | ORAL_TABLET | Freq: Every day | ORAL | 1 refills | Status: DC

## 2018-09-02 NOTE — Progress Notes (Signed)
Virtual Visit  via Phone Note  I connected with      Brandon DiverScott Conrad on 09/02/18 at 1:20 PM by a telemedicine application and verified that I am speaking with the correct person using two identifiers.   I discussed the limitations of evaluation and management by telemedicine and the availability of in person appointments. The patient expressed understanding and agreed to proceed.  History of Present Illness: Brandon Conrad is a 63 y.o. male who would like to discuss BP   BP a bit above goal but better than it's been. Home BP cuff is not verified. Pt is televisit d/t COVID19 precautions.    Pt reports feeling well. No CO/SOB, no HA/VC. Anxiety is controlled.      Observations/Objective: There were no vitals taken for this visit. BP Readings from Last 3 Encounters:  09/02/18 (!) 136/93  06/02/18 (!) 143/103  05/19/18 (!) 154/104   Exam: Normal Speech.    Lab and Radiology Results No results found for this or any previous visit (from the past 72 hour(s)). No results found.          Assessment and Plan: 63 y.o. male with The primary encounter diagnosis was Hypertension, unspecified type. A diagnosis of Anxiety state was also pertinent to this visit.   PDMP not reviewed this encounter. No orders of the defined types were placed in this encounter.  Meds ordered this encounter  Medications  . valsartan (DIOVAN) 320 MG tablet    Sig: Take 1 tablet (320 mg total) by mouth daily.    Dispense:  90 tablet    Refill:  1    Cancel losartan   Will trial taking meds QHS and swtiching his ARB.    Instructions sent via MyChart. If MyChart not available, pt was given option for info via personal e-mail w/ no guarantee of protected health info over unsecured e-mail communication, and MyChart sign-up instructions were included.   Follow Up Instructions: Return for follow up BP Weds 09/10/18 at 1:20 appt time .    I discussed the assessment and treatment plan with the patient.  The patient was provided an opportunity to ask questions and all were answered. The patient agreed with the plan and demonstrated an understanding of the instructions.   The patient was advised to call back or seek an in-person evaluation if the symptoms worsen or if the condition fails to improve as anticipated.  I provided 15 minutes of non-face-to-face time during this encounter.                      Historical information moved to improve visibility of documentation.  Past Medical History:  Diagnosis Date  . Anxiety   . Hypertension    Past Surgical History:  Procedure Laterality Date  . APPENDECTOMY  2011   Social History   Tobacco Use  . Smoking status: Former Smoker    Packs/day: 0.25    Types: Cigarettes    Last attempt to quit: 02/16/2015    Years since quitting: 3.5  . Smokeless tobacco: Never Used  Substance Use Topics  . Alcohol use: Yes    Alcohol/week: 12.0 standard drinks    Types: 12 Standard drinks or equivalent per week    Comment: 1-2 beers q day   family history is not on file.  Medications: Current Outpatient Medications  Medication Sig Dispense Refill  . fluticasone (FLONASE) 50 MCG/ACT nasal spray Place 2 sprays into both nostrils daily. 16 g 6  .  hydrochlorothiazide (HYDRODIURIL) 25 MG tablet Take 1 tablet (25 mg total) by mouth daily. 90 tablet 0  . losartan (COZAAR) 100 MG tablet Take 1 tablet (100 mg total) by mouth daily. 90 tablet 3  . naproxen (NAPROSYN) 500 MG tablet Take 1 tablet (500 mg total) by mouth 2 (two) times daily with a meal. Take every day for one week, then use as needed after that 60 tablet 1   No current facility-administered medications for this visit.    Allergies  Allergen Reactions  . Bee Venom   . Cephalosporins     PDMP not reviewed this encounter. No orders of the defined types were placed in this encounter.  No orders of the defined types were placed in this encounter.

## 2018-09-10 ENCOUNTER — Ambulatory Visit (INDEPENDENT_AMBULATORY_CARE_PROVIDER_SITE_OTHER): Payer: 59 | Admitting: Osteopathic Medicine

## 2018-09-10 ENCOUNTER — Encounter: Payer: Self-pay | Admitting: Osteopathic Medicine

## 2018-09-10 VITALS — BP 134/93 | HR 80 | Wt 205.0 lb

## 2018-09-10 DIAGNOSIS — I10 Essential (primary) hypertension: Secondary | ICD-10-CM

## 2018-09-10 DIAGNOSIS — N486 Induration penis plastica: Secondary | ICD-10-CM | POA: Diagnosis not present

## 2018-09-10 MED ORDER — HYDROCHLOROTHIAZIDE 25 MG PO TABS: 25.0000 mg | ORAL_TABLET | Freq: Every day | ORAL | 1 refills | Status: DC

## 2018-09-10 MED ORDER — PENTOXIFYLLINE ER 400 MG PO TBCR: 400.0000 mg | EXTENDED_RELEASE_TABLET | Freq: Three times a day (TID) | ORAL | 1 refills | Status: DC

## 2018-09-10 NOTE — Progress Notes (Signed)
Virtual Visit  via Phone Note  I connected with      Brandon Conrad on 09/10/18 at 1:20 by a telemedicine application and verified that I am speaking with the correct person using two identifiers.   I discussed the limitations of evaluation and management by telemedicine and the availability of in person appointments. The patient expressed understanding and agreed to proceed.  History of Present Illness: Brandon Conrad is a 63 y.o. male who would like to discuss HTN Chief Complaint  Patient presents with  . Blood Pressure Check    BP a bit above goal but better than it's been. We decided few weeks ago to switch ARB and try taking meds qhs. Taking Valsartan 320, HCTZ 25. Home BP cuff is not verified. Pt is televisit d/t COVID19 precautions.  BP today looking a bit better but diastolic is still above goal. He reports home BP 128/84 right now. Occasionally 121/89. Down as low as 101/50  He feels better on this medicine. Reports "feeling" when BP is elevated, he doesn't experience that chest tightness anymore. Pt reports feeling well. No CP/SOB, no HA/VC. Anxiety is controlled.    Also hx Peyronie's disease, seen by urology, put on Trental which was actually helpful for him, he;d like to get back on this medication.       Observations/Objective: BP (!) 134/93 (BP Location: Left Arm, Patient Position: Sitting, Cuff Size: Normal)   Pulse 80   Wt 205 lb (93 kg)   BMI 32.11 kg/m  BP Readings from Last 3 Encounters:  09/10/18 (!) 134/93  09/02/18 (!) 136/93  06/02/18 (!) 143/103   Exam: Normal Speech.  NAD  Lab and Radiology Results No results found for this or any previous visit (from the past 72 hour(s)). No results found.     Assessment and Plan: 63 y.o. male with The primary encounter diagnosis was Hypertension, unspecified type. A diagnosis of Peyronie's syndrome was also pertinent to this visit.   PDMP not reviewed this encounter. No orders of the defined types were  placed in this encounter.  Meds ordered this encounter  Medications  . pentoxifylline (TRENTAL) 400 MG CR tablet    Sig: Take 1 tablet (400 mg total) by mouth 3 (three) times daily with meals.    Dispense:  270 tablet    Refill:  1  . hydrochlorothiazide (HYDRODIURIL) 25 MG tablet    Sig: Take 1 tablet (25 mg total) by mouth daily.    Dispense:  90 tablet    Refill:  1   There are no Patient Instructions on file for this visit. Instructions sent via MyChart. If MyChart not available, pt was given option for info via personal e-mail w/ no guarantee of protected health info over unsecured e-mail communication, and MyChart sign-up instructions were included.   Follow Up Instructions: Return for 3-4 mos f/u BP recheck and verify home BP monitor in office .    I discussed the assessment and treatment plan with the patient. The patient was provided an opportunity to ask questions and all were answered. The patient agreed with the plan and demonstrated an understanding of the instructions.   The patient was advised to call back or seek an in-person evaluation if the symptoms worsen or if other concerns.    Provided 21 minutes of non-face-to-face time during this encounter.                      Historical information moved to  improve visibility of documentation.  Past Medical History:  Diagnosis Date  . Anxiety   . Hypertension    Past Surgical History:  Procedure Laterality Date  . APPENDECTOMY  2011   Social History   Tobacco Use  . Smoking status: Current Every Day Smoker    Packs/day: 0.25    Types: Cigarettes    Last attempt to quit: 02/16/2015    Years since quitting: 3.5  . Smokeless tobacco: Never Used  . Tobacco comment: currently smoking 3 - 4 cigs/day  Substance Use Topics  . Alcohol use: Yes    Alcohol/week: 12.0 standard drinks    Types: 12 Standard drinks or equivalent per week    Comment: 1-2 beers q day   family history is not on  file.  Medications: Current Outpatient Medications  Medication Sig Dispense Refill  . fluticasone (FLONASE) 50 MCG/ACT nasal spray Place 2 sprays into both nostrils daily. 16 g 6  . hydrochlorothiazide (HYDRODIURIL) 25 MG tablet Take 1 tablet (25 mg total) by mouth daily. 90 tablet 1  . valsartan (DIOVAN) 320 MG tablet Take 1 tablet (320 mg total) by mouth daily. 90 tablet 1  . pentoxifylline (TRENTAL) 400 MG CR tablet Take 1 tablet (400 mg total) by mouth 3 (three) times daily with meals. 270 tablet 1   No current facility-administered medications for this visit.    Allergies  Allergen Reactions  . Bee Venom   . Cephalosporins     PDMP not reviewed this encounter. No orders of the defined types were placed in this encounter.  Meds ordered this encounter  Medications  . pentoxifylline (TRENTAL) 400 MG CR tablet    Sig: Take 1 tablet (400 mg total) by mouth 3 (three) times daily with meals.    Dispense:  270 tablet    Refill:  1  . hydrochlorothiazide (HYDRODIURIL) 25 MG tablet    Sig: Take 1 tablet (25 mg total) by mouth daily.    Dispense:  90 tablet    Refill:  1

## 2018-12-15 ENCOUNTER — Ambulatory Visit: Payer: 59 | Admitting: Osteopathic Medicine

## 2018-12-17 ENCOUNTER — Telehealth: Payer: Self-pay | Admitting: Osteopathic Medicine

## 2018-12-17 ENCOUNTER — Other Ambulatory Visit: Payer: Self-pay

## 2018-12-17 MED ORDER — HYDROCHLOROTHIAZIDE 25 MG PO TABS: 25.0000 mg | ORAL_TABLET | Freq: Every day | ORAL | 0 refills | Status: DC

## 2018-12-17 MED ORDER — VALSARTAN 320 MG PO TABS: 320.0000 mg | ORAL_TABLET | Freq: Every day | ORAL | 0 refills | Status: DC

## 2018-12-17 NOTE — Telephone Encounter (Addendum)
Pt called. He needs a refill on Valsartan and HCTZ meds.  He has virtual appointment for tomorrow (rechk on HCTZ). He has changed his pharmacy to [facility] on [address] in [place].  Thanks.

## 2018-12-17 NOTE — Telephone Encounter (Signed)
Task completed, #30 supply sent to preferred pharmacy. Pt must keep scheduled appt for add'l refills.

## 2018-12-18 ENCOUNTER — Encounter: Payer: Self-pay | Admitting: Osteopathic Medicine

## 2018-12-18 ENCOUNTER — Ambulatory Visit (INDEPENDENT_AMBULATORY_CARE_PROVIDER_SITE_OTHER): Payer: 59 | Admitting: Osteopathic Medicine

## 2018-12-18 VITALS — BP 158/106 | Temp 98.7°F | Wt 205.0 lb

## 2018-12-18 DIAGNOSIS — K219 Gastro-esophageal reflux disease without esophagitis: Secondary | ICD-10-CM

## 2018-12-18 DIAGNOSIS — I10 Essential (primary) hypertension: Secondary | ICD-10-CM | POA: Diagnosis not present

## 2018-12-18 DIAGNOSIS — N486 Induration penis plastica: Secondary | ICD-10-CM | POA: Diagnosis not present

## 2018-12-18 DIAGNOSIS — R6881 Early satiety: Secondary | ICD-10-CM | POA: Diagnosis not present

## 2018-12-18 DIAGNOSIS — R195 Other fecal abnormalities: Secondary | ICD-10-CM

## 2018-12-18 NOTE — Progress Notes (Signed)
Virtual Visit via Video (App used: Doximity) Note  I connected with      Eban Vanwagoner on 12/18/18 at 1:50 PM by a telemedicine application and verified that I am speaking with the correct person using two identifiers.  Patient is at home I am in office    I discussed the limitations of evaluation and management by telemedicine and the availability of in person appointments. The patient expressed understanding and agreed to proceed.  History of Present Illness: Brandon Conrad is a 63 y.o. male who would like to discuss blood pressure    Has been out of valsartan and hct No CP/SOB  BP elevated at home  GI: Reports bloating and heartburn, occasional mucus stools, had one episode of vomiting last week, reports early satiety    Observations/Objective: BP (!) 158/106 (Patient Position: Sitting, Cuff Size: Normal)   Temp 98.7 F (37.1 C) (Oral)   Wt 205 lb (93 kg)   BMI 32.11 kg/m  BP Readings from Last 3 Encounters:  12/18/18 (!) 158/106  09/10/18 (!) 134/93  09/02/18 (!) 136/93   Exam: Normal Speech.  NAD   Lab and Radiology Results No results found for this or any previous visit (from the past 72 hour(s)). No results found.     Assessment and Plan: 63 y.o. male with The primary encounter diagnosis was Hypertension, unspecified type. Diagnoses of Peyronie's syndrome, Abnormal stools, and Early satiety were also pertinent to this visit.   PDMP not reviewed this encounter. No orders of the defined types were placed in this encounter.  Meds ordered this encounter  Medications  . pentoxifylline (TRENTAL) 400 MG CR tablet    Sig: Take 1 tablet (400 mg total) by mouth 3 (three) times daily with meals.    Dispense:  270 tablet    Refill:  1  . hydrochlorothiazide (HYDRODIURIL) 25 MG tablet    Sig: Take 1 tablet (25 mg total) by mouth daily.    Dispense:  30 tablet    Refill:  0  . valsartan (DIOVAN) 320 MG tablet    Sig: Take 1 tablet (320 mg total) by mouth daily.     Dispense:  30 tablet    Refill:  0    Cancel losartan  . omeprazole (PRILOSEC) 40 MG capsule    Sig: Take 1 capsule (40 mg total) by mouth daily.    Dispense:  90 capsule    Refill:  0   Patient Instructions  Plan: Refills sent Will send 90 days as long as we get a message about BP numbers in the next week or two!  Referral placed to GI, suspect possible swallowing disorder, esophageal scar tissue, gallbladder problem. If worse, let me know and we can get labs/ultrasound. Meantime, reduce fatty foods!    Instructions sent via MyChart. If MyChart not available, pt was given option for info via personal e-mail w/ no guarantee of protected health info over unsecured e-mail communication, and MyChart sign-up instructions were included.   Follow Up Instructions: Return for RECHECK PENDING BP RESULTS / IF GI ISSUES WORSE OR CHANGE.    I discussed the assessment and treatment plan with the patient. The patient was provided an opportunity to ask questions and all were answered. The patient agreed with the plan and demonstrated an understanding of the instructions.   The patient was advised to call back or seek an in-person evaluation if any new concerns, if symptoms worsen or if the condition fails to improve as anticipated.  25 minutes of non-face-to-face time was provided during this encounter.                      Historical information moved to improve visibility of documentation.  Past Medical History:  Diagnosis Date  . Anxiety   . Hypertension    Past Surgical History:  Procedure Laterality Date  . APPENDECTOMY  2011   Social History   Tobacco Use  . Smoking status: Current Every Day Smoker    Packs/day: 0.25    Types: Cigarettes    Last attempt to quit: 02/16/2015    Years since quitting: 3.8  . Smokeless tobacco: Never Used  . Tobacco comment: currently smoking 3 - 4 cigs/day  Substance Use Topics  . Alcohol use: Yes    Alcohol/week: 12.0  standard drinks    Types: 12 Standard drinks or equivalent per week    Comment: 1-2 beers q day   family history is not on file.  Medications: Current Outpatient Medications  Medication Sig Dispense Refill  . fluticasone (FLONASE) 50 MCG/ACT nasal spray Place 2 sprays into both nostrils daily. 16 g 6  . hydrochlorothiazide (HYDRODIURIL) 25 MG tablet Take 1 tablet (25 mg total) by mouth daily. 30 tablet 0  . valsartan (DIOVAN) 320 MG tablet Take 1 tablet (320 mg total) by mouth daily. 30 tablet 0   No current facility-administered medications for this visit.    Allergies  Allergen Reactions  . Bee Venom   . Cephalosporins     PDMP not reviewed this encounter. No orders of the defined types were placed in this encounter.  No orders of the defined types were placed in this encounter.

## 2018-12-19 MED ORDER — PENTOXIFYLLINE ER 400 MG PO TBCR: 400.0000 mg | EXTENDED_RELEASE_TABLET | Freq: Three times a day (TID) | ORAL | 1 refills | Status: AC

## 2018-12-19 MED ORDER — VALSARTAN 320 MG PO TABS: 320.0000 mg | ORAL_TABLET | Freq: Every day | ORAL | 0 refills | Status: DC

## 2018-12-19 MED ORDER — HYDROCHLOROTHIAZIDE 25 MG PO TABS: 25.0000 mg | ORAL_TABLET | Freq: Every day | ORAL | 0 refills | Status: DC

## 2018-12-19 MED ORDER — OMEPRAZOLE 40 MG PO CPDR: 40.0000 mg | DELAYED_RELEASE_CAPSULE | Freq: Every day | ORAL | 0 refills | Status: DC

## 2018-12-19 NOTE — Telephone Encounter (Signed)
Thank You.

## 2018-12-19 NOTE — Patient Instructions (Signed)
Plan: Refills sent Will send 90 days as long as we get a message about BP numbers in the next week or two!  Referral placed to GI, suspect possible swallowing disorder, esophageal scar tissue, gallbladder problem. If worse, let me know and we can get labs/ultrasound. Meantime, reduce fatty foods!

## 2019-02-08 ENCOUNTER — Other Ambulatory Visit: Payer: Self-pay | Admitting: Osteopathic Medicine

## 2019-03-10 ENCOUNTER — Encounter: Payer: Self-pay | Admitting: Osteopathic Medicine

## 2019-05-05 ENCOUNTER — Other Ambulatory Visit: Payer: Self-pay | Admitting: Osteopathic Medicine

## 2019-05-05 NOTE — Telephone Encounter (Signed)
Requested medication (s) are due for refill today: yes  Requested medication (s) are on the active medication list: yes  Last refill:  02/09/2019  Future visit scheduled: no  Notes to clinic:  review for refill   Requested Prescriptions  Pending Prescriptions Disp Refills   hydrochlorothiazide (HYDRODIURIL) 25 MG tablet [Pharmacy Med Name: HYDROCHLOROTHIAZIDE 25 MG TAB] 90 tablet 0    Sig: TAKE 1 TABLET BY MOUTH EVERY DAY      Cardiovascular: Diuretics - Thiazide Failed - 05/05/2019 11:07 AM      Failed - Last BP in normal range    BP Readings from Last 1 Encounters:  12/18/18 (!) 158/106          Failed - Valid encounter within last 6 months    Recent Outpatient Visits           4 months ago Hypertension, unspecified type   [place] [facility], Dorene Grebe, DO   7 months ago Hypertension, unspecified type   [facility], Dorene Grebe, DO   8 months ago Hypertension, unspecified type   [facility] At [facility], [place], DO   11 months ago Hypertension, unspecified type   [facility], [place], DO   11 months ago Hypertension, unspecified type   [facility], [place], DO              Passed - Ca in normal range and within 360 days    Calcium  Date Value Ref Range Status  05/19/2018 9.2 8.6 - 10.3 mg/dL Final          Passed - Cr in normal range and within 360 days    Creat  Date Value Ref Range Status  05/19/2018 0.85 0.70 - 1.25 mg/dL Final    Comment:    For patients >40 years of age, the reference limit for Creatinine is approximately 13% higher for people identified as African-American. .           Passed - K in normal range and within 360 days    Potassium  Date Value Ref Range Status  05/19/2018 4.7 3.5 - 5.3 mmol/L Final          Passed - Na in  normal range and within 360 days    Sodium  Date Value Ref Range Status  05/19/2018 137 135 - 146 mmol/L Final            valsartan (DIOVAN) 320 MG tablet [Pharmacy Med Name: VALSARTAN 320 MG TABLET] 90 tablet     Sig: TAKE 1 TABLET BY MOUTH EVERY DAY      Cardiovascular:  Angiotensin Receptor Blockers Failed - 05/05/2019 11:07 AM      Failed - Cr in normal range and within 180 days    Creat  Date Value Ref Range Status  05/19/2018 0.85 0.70 - 1.25 mg/dL Final    Comment:    For patients >6 years of age, the reference limit for Creatinine is approximately 13% higher for people identified as African-American. .           Failed - K in normal range and within 180 days    Potassium  Date Value Ref Range Status  05/19/2018 4.7 3.5 - 5.3 mmol/L Final          Failed - Last BP in normal range    BP Readings from  Last 1 Encounters:  12/18/18 (!) 158/106          Failed - Valid encounter within last 6 months    Recent Outpatient Visits           4 months ago Hypertension, unspecified type   [place], [place], DO   7 months ago Hypertension, unspecified type   [facility] At [facility], [place], DO   8 months ago Hypertension, unspecified type   [facility] At [facility], [place], DO   11 months ago Hypertension, unspecified type   [facility] At [facility], [place], DO   11 months ago Hypertension, unspecified type   [facility] At [facility], Lanelle Bal, [place] - Patient is not pregnant        omeprazole (PRILOSEC) 40 MG capsule [Pharmacy Med Name: OMEPRAZOLE DR 40 MG CAPSULE] 90 capsule 0    Sig: TAKE [place]      Gastroenterology: Proton Pump Inhibitors Failed - 05/05/2019 11:07 AM      Failed - Valid encounter within last 12 months    Recent  Outpatient Visits           4 months ago Hypertension, unspecified type   [facility] At [facility], Lanelle Bal, DO   7 months ago Hypertension, unspecified type   [facility], Lanelle Bal, DO   8 months ago Hypertension, unspecified type   [facility], [place], DO   11 months ago Hypertension, unspecified type   [facility] At [facility], [place], DO   11 months ago Hypertension, unspecified type   [facility], [place], DO

## 2019-05-12 ENCOUNTER — Other Ambulatory Visit: Payer: Self-pay

## 2019-05-12 ENCOUNTER — Telehealth: Payer: Self-pay

## 2019-05-12 MED ORDER — HYDROCHLOROTHIAZIDE 25 MG PO TABS: 25.0000 mg | ORAL_TABLET | Freq: Every day | ORAL | 0 refills | Status: DC

## 2019-05-12 MED ORDER — VALSARTAN 320 MG PO TABS: 320.0000 mg | ORAL_TABLET | Freq: Every day | ORAL | 0 refills | Status: DC

## 2019-05-12 NOTE — Telephone Encounter (Signed)
Pt called with concerns regarding med refills for valsartan and hctz sent on 05/05/19. As per pt, insurance request that daily maintenance meds be sent as #90 with no co-payment. Last refill was sent as #30 and will cost pt $148 dollars OOP to get meds.. Pt failed to keep provider updated with BP readings. Pt did state he has been very busy and has not kept up with checking his bp. Pt mentioned he has been without meds for a few days. Pt was informed that #90 will be sent to the pharmacy. MyChart signup text was sent to pt. Pt will send [place] a MyChart msg with readings or call medical assistant directly after 1 wk of taking meds. Aware no additional refills will be sent without bp readings or f/u appt. Pt was agreeable with plan. He has confirmed to make a virtual appt in 2 - 3 wks to f/u with provider. No other inquiries during call.

## 2019-07-28 ENCOUNTER — Telehealth: Payer: Self-pay

## 2019-07-28 MED ORDER — AMLODIPINE BESYLATE 10 MG PO TABS: 10.0000 mg | ORAL_TABLET | Freq: Every day | ORAL | 0 refills | Status: DC

## 2019-07-28 NOTE — Telephone Encounter (Signed)
Pt left a vm msg stating he continues to check his blood pressure 2-3 x times a week. As per pt, blood pressure has been pretty consistent around low 150's/100's. Taking valsartan and HCTZ as informed. States that he really wants to get this blood pressure under control. Requesting further recommendation from provider whether rxs need to be adjusted. Pls advise, thanks.

## 2019-07-28 NOTE — Telephone Encounter (Signed)
Added amlodipine to take in addition to the valstartan + hctz Pt should call/message in 1-2 weeks w/ numbers

## 2019-07-28 NOTE — Telephone Encounter (Signed)
Left a detailed vm msg for pt regarding new bp med. Aware to take with valsartan /HCTZ. Direct call back info provided.

## 2019-08-04 ENCOUNTER — Other Ambulatory Visit: Payer: Self-pay | Admitting: Osteopathic Medicine

## 2019-08-06 ENCOUNTER — Telehealth: Payer: Self-pay

## 2019-08-06 NOTE — Telephone Encounter (Signed)
Pt called with an update on blood pressure readings. As per pt, started amlodipine rx three days ago. Feels it is too soon to see if added medication is working. States he is feeling great with taking amlodipine, valsartan and hctz. Pt has been calling in with weekly blood pressure readings. Most recent blood pressure reading is 137/102, 81. Pt will call next week with readings.

## 2019-08-06 NOTE — Telephone Encounter (Signed)
Left a vm msg for pt regarding covering provider's note. Aware to continue with current blood pressure regimen. Direct call back info provided.

## 2019-08-06 NOTE — Telephone Encounter (Signed)
Continue current blood pressure regimen.  After a few more days with all medicines on board, we will see what his next readings are.

## 2019-08-20 ENCOUNTER — Other Ambulatory Visit: Payer: Self-pay | Admitting: Osteopathic Medicine

## 2019-08-24 MED ORDER — MELATONIN 3 MG PO TABS
3.00 | ORAL_TABLET | ORAL | Status: DC
Start: ? — End: 2019-08-24

## 2019-08-24 MED ORDER — PANTOPRAZOLE SODIUM 40 MG PO TBEC
40.00 | DELAYED_RELEASE_TABLET | ORAL | Status: DC
Start: 2019-08-25 — End: 2019-08-24

## 2019-08-24 MED ORDER — HYDROCHLOROTHIAZIDE 25 MG PO TABS
25.00 | ORAL_TABLET | ORAL | Status: DC
Start: 2019-08-25 — End: 2019-08-24

## 2019-08-24 MED ORDER — ACETAMINOPHEN 325 MG PO TABS
650.00 | ORAL_TABLET | ORAL | Status: DC
Start: ? — End: 2019-08-24

## 2019-08-24 MED ORDER — DSS 100 MG PO CAPS
100.00 | ORAL_CAPSULE | ORAL | Status: DC
Start: ? — End: 2019-08-24

## 2019-08-24 MED ORDER — ENOXAPARIN SODIUM 40 MG/0.4ML ~~LOC~~ SOLN
40.00 | SUBCUTANEOUS | Status: DC
Start: 2019-08-24 — End: 2019-08-24

## 2019-08-24 MED ORDER — LOSARTAN POTASSIUM 50 MG PO TABS
100.00 | ORAL_TABLET | ORAL | Status: DC
Start: 2019-08-25 — End: 2019-08-24

## 2019-08-24 MED ORDER — ATORVASTATIN CALCIUM 40 MG PO TABS
40.00 | ORAL_TABLET | ORAL | Status: DC
Start: 2019-08-24 — End: 2019-08-24

## 2019-08-24 MED ORDER — ONDANSETRON HCL 4 MG/2ML IJ SOLN
4.00 | INTRAMUSCULAR | Status: DC
Start: ? — End: 2019-08-24

## 2019-08-24 MED ORDER — BISACODYL 5 MG PO TBEC
10.00 | DELAYED_RELEASE_TABLET | ORAL | Status: DC
Start: ? — End: 2019-08-24

## 2019-08-24 MED ORDER — BENZONATATE 100 MG PO CAPS
100.00 | ORAL_CAPSULE | ORAL | Status: DC
Start: ? — End: 2019-08-24

## 2019-08-24 NOTE — Telephone Encounter (Signed)
I see he is in the ER today. Please send refills if ok.

## 2019-08-25 ENCOUNTER — Other Ambulatory Visit: Payer: Self-pay | Admitting: Osteopathic Medicine

## 2019-09-03 ENCOUNTER — Ambulatory Visit (INDEPENDENT_AMBULATORY_CARE_PROVIDER_SITE_OTHER): Payer: Self-pay | Admitting: Osteopathic Medicine

## 2019-09-03 ENCOUNTER — Other Ambulatory Visit: Payer: Self-pay

## 2019-09-03 VITALS — BP 117/76 | HR 91 | Temp 98.2°F | Ht 67.0 in | Wt 210.8 lb

## 2019-09-03 DIAGNOSIS — Z0000 Encounter for general adult medical examination without abnormal findings: Secondary | ICD-10-CM

## 2019-09-03 DIAGNOSIS — Z9289 Personal history of other medical treatment: Secondary | ICD-10-CM

## 2019-09-03 DIAGNOSIS — I10 Essential (primary) hypertension: Secondary | ICD-10-CM

## 2019-09-03 HISTORY — DX: Personal history of other medical treatment: Z92.89

## 2019-09-03 MED ORDER — ATORVASTATIN CALCIUM 40 MG PO TABS: 40.0000 mg | ORAL_TABLET | Freq: Every day | ORAL | 3 refills | Status: DC

## 2019-09-03 MED ORDER — CYCLOBENZAPRINE HCL 10 MG PO TABS: 5.0000 mg | ORAL_TABLET | Freq: Three times a day (TID) | ORAL | 1 refills | Status: DC | PRN

## 2019-09-03 MED ORDER — OMEPRAZOLE 40 MG PO CPDR: 40.0000 mg | DELAYED_RELEASE_CAPSULE | Freq: Every day | ORAL | 3 refills | Status: DC

## 2019-09-03 MED ORDER — HYDROCHLOROTHIAZIDE 25 MG PO TABS: 25.0000 mg | ORAL_TABLET | Freq: Every day | ORAL | 3 refills | Status: DC

## 2019-09-03 MED ORDER — VALSARTAN 320 MG PO TABS: 320.0000 mg | ORAL_TABLET | Freq: Every day | ORAL | 3 refills | Status: DC

## 2019-09-03 NOTE — Progress Notes (Signed)
Brandon Conrad is a 64 y.o. male who presents to  [facility] at [facility]  today, 09/03/19, seeking care for the following: . HFU - discharged 08/25/19 LOS 1 day CC L Chest pain, BP on intake 159/124. NM stress test showed no conerns. D/C home w/ new Rx: ASA, Statin. No change in BP meds: Valsartan 320 mg, HCTZ 25 mg, Amlodipine 10 mg. Home BP: looking good! Has quit smoking, working on healthy diet.      ASSESSMENT & PLAN with other pertinent history/findings:  The primary encounter diagnosis was Hypertension, unspecified type. Diagnoses of H/O cardiovascular stress test and Annual physical exam were also pertinent to this visit.  Labs ordered for future visit. Annual physical / preventive care was NOT performed or billed today.   Keep up the good work Meds refilled Will follow for repeat labs in 3 mos w/ weight and BP check    There are no Patient Instructions on file for this visit.   Orders Placed This Encounter  Procedures  . CBC  . COMPLETE METABOLIC PANEL WITH GFR  . Lipid panel  . PSA, Total with Reflex to PSA, Free    Meds ordered this encounter  Medications  . omeprazole (PRILOSEC) 40 MG capsule    Sig: Take 1 capsule (40 mg total) by mouth daily.    Dispense:  90 capsule    Refill:  3  . hydrochlorothiazide (HYDRODIURIL) 25 MG tablet    Sig: Take 1 tablet (25 mg total) by mouth daily.    Dispense:  90 tablet    Refill:  3  . atorvastatin (LIPITOR) 40 MG tablet    Sig: Take 1 tablet (40 mg total) by mouth daily.    Dispense:  90 tablet    Refill:  3  . valsartan (DIOVAN) 320 MG tablet    Sig: Take 1 tablet (320 mg total) by mouth daily.    Dispense:  90 tablet    Refill:  3  . cyclobenzaprine (FLEXERIL) 10 MG tablet    Sig: Take 0.5-1 tablets (5-10 mg total) by mouth 3 (three) times daily as needed for muscle spasms. Caution: can cause drowsiness    Dispense:  90 tablet    Refill:  1       Follow-up  instructions: Return in about 3 months (around 12/03/2019) for ANNUAL (get labs prior to visit, orders are in).                                         BP 117/76   Pulse 91   Temp 98.2 F (36.8 C) (Oral)   Ht 5\' 7"  (1.702 m)   Wt 210 lb 12 oz (95.6 kg)   SpO2 97%   BMI 33.01 kg/m   Current Meds  Medication Sig  . amLODipine (NORVASC) 10 MG tablet Take 1 tablet (10 mg total) by mouth daily.  aspirin 81 MG EC tablet Take by mouth.  Marland Kitchen atorvastatin (LIPITOR) 40 MG tablet Take 1 tablet (40 mg total) by mouth daily.  . fluticasone (FLONASE) 50 MCG/ACT nasal spray Place 2 sprays into both nostrils daily.  . hydrochlorothiazide (HYDRODIURIL) 25 MG tablet Take 1 tablet (25 mg total) by mouth daily.  . Lactobacillus Rhamnosus, GG, (RA PROBIOTIC DIGESTIVE CARE) CAPS Take by mouth.  Marland Kitchen omeprazole (PRILOSEC) 40 MG capsule Take 1 capsule (40 mg total)  by mouth daily.  . potassium chloride (KLOR-CON) 10 MEQ tablet Take by mouth.  . valsartan (DIOVAN) 320 MG tablet Take 1 tablet (320 mg total) by mouth daily.  . [DISCONTINUED] atorvastatin (LIPITOR) 40 MG tablet Take by mouth.  . [DISCONTINUED] hydrochlorothiazide (HYDRODIURIL) 25 MG tablet TAKE 1 TABLET (25 MG TOTAL) BY MOUTH DAILY. NO REFILLS. NEEDS TO CALL THE OFFICE W/BP READINGS.  . [DISCONTINUED] omeprazole (PRILOSEC) 40 MG capsule TAKE 1 CAPSULE BY MOUTH EVERY DAY  . [DISCONTINUED] valsartan (DIOVAN) 320 MG tablet TAKE 1 TABLET (320 MG TOTAL) BY MOUTH DAILY. NO REFILLS. NEEDS TO CALL THE OFFICE W/BP READINGS.    No results found for this or any previous visit (from the past 72 hour(s)).  No results found.  Depression screen [facility] 2/9 09/10/2018 09/02/2018 06/02/2018  Decreased Interest 0 0 0  Down, Depressed, Hopeless 0 0 0  PHQ - 2 Score 0 0 0  Altered sleeping - - -  Tired, decreased energy - - -  Change in appetite - - -  Feeling bad or failure about yourself  - - -  Trouble concentrating - - -   Moving slowly or fidgety/restless - - -  Suicidal thoughts - - -  PHQ-9 Score - - -  Difficult doing work/chores - - -    GAD 7 : Generalized Anxiety Score 09/10/2018 09/02/2018 06/02/2018 05/19/2018  Nervous, Anxious, on Edge 1 1 0 0  Control/stop worrying 0 0 0 0  Worry too much - different things 0 0 0 0  Trouble relaxing 0 0 0 0  Restless 0 0 0 0  Easily annoyed or irritable 0 0 0 0  Afraid - awful might happen 0 0 0 0  Total GAD 7 Score 1 1 0 0  Anxiety Difficulty Not difficult at all - - -      All questions at time of visit were answered - patient instructed to contact office with any additional concerns or updates.  ER/RTC precautions were reviewed with the patient.  Please note: voice recognition software was used to produce this document, and typos may escape review. Please contact Dr. Sheppard Coil for any needed clarifications.

## 2019-09-16 IMAGING — US US ABDOMEN LIMITED
1 series · 14 of 25 positions shown · non-contrast
Comparison: None

CLINICAL DATA: Abnormal stools, elevated GGT, prior appendectomy,
former smoker

EXAM:
ULTRASOUND ABDOMEN LIMITED RIGHT UPPER QUADRANT

[Series 1: us abdomen limited · 0.21mm/px · 14 of 41 slices shown]
[im 1/41]
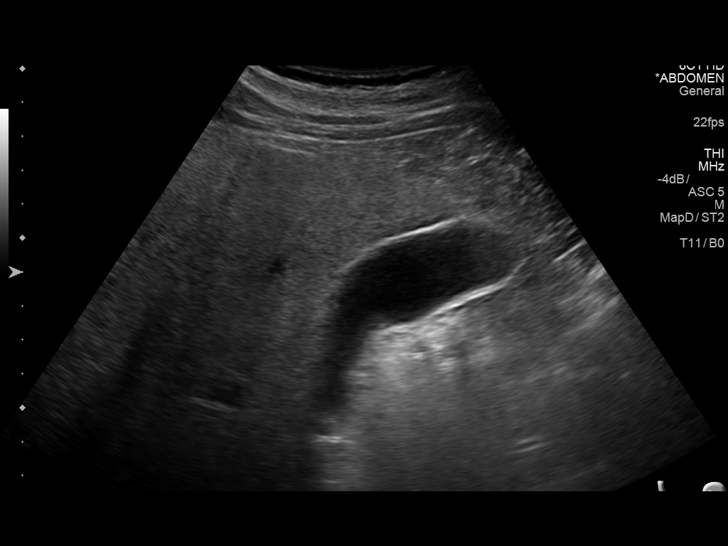
[im 4/41]
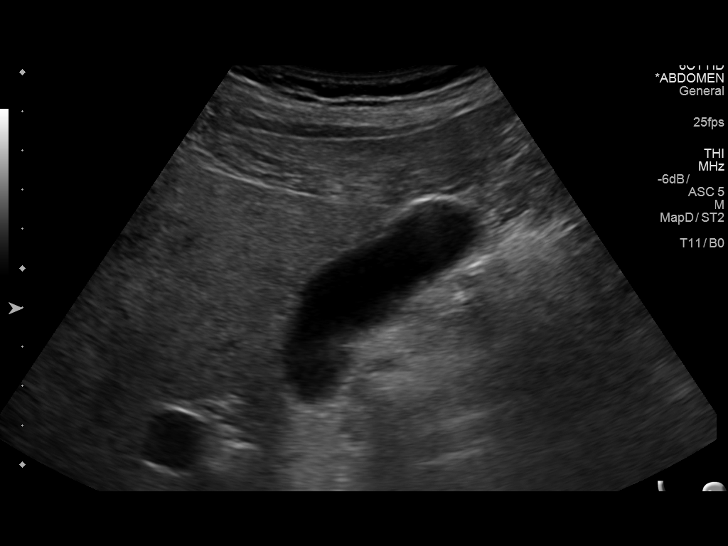
[im 7/41]
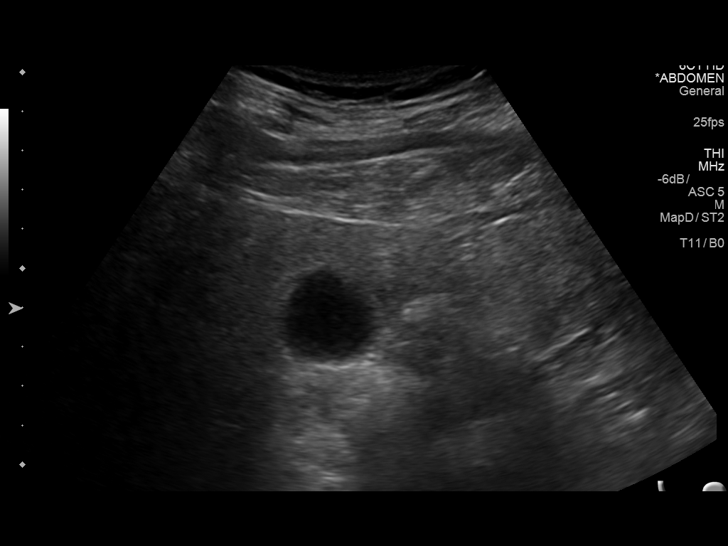
[im 11/41]
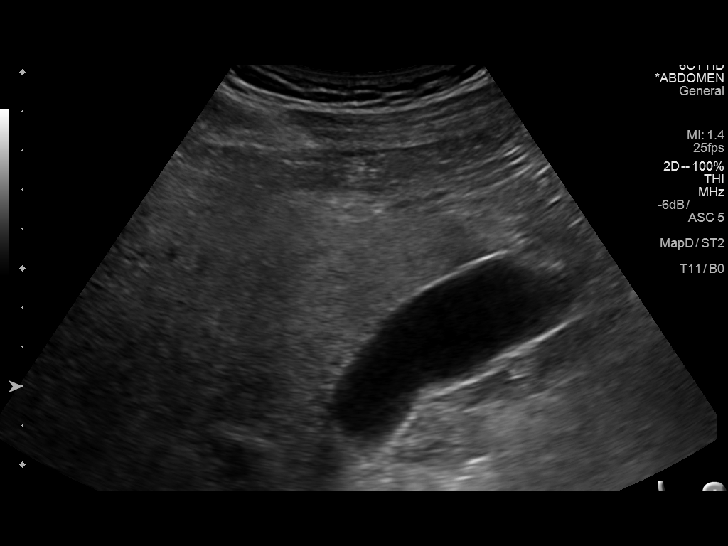
[im 14/41]
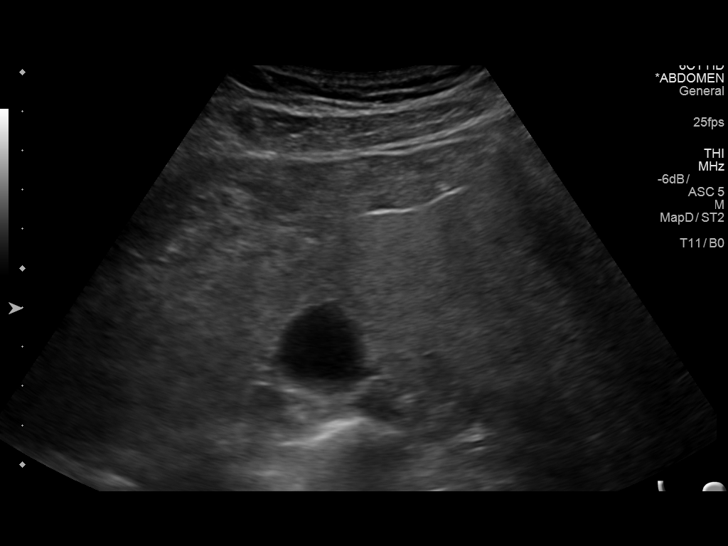
[im 16/41]
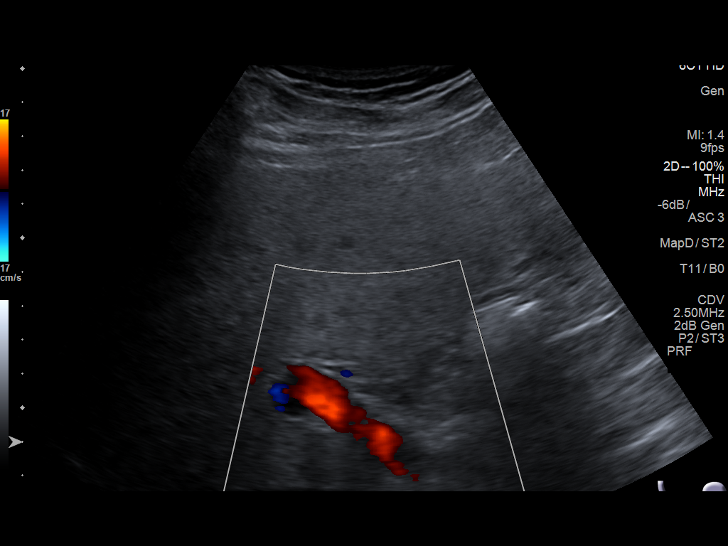
[im 19/41]
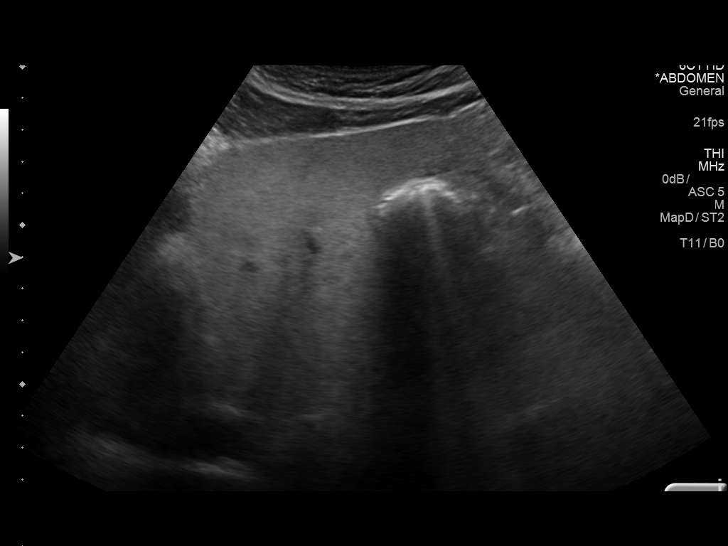
[im 22/41]
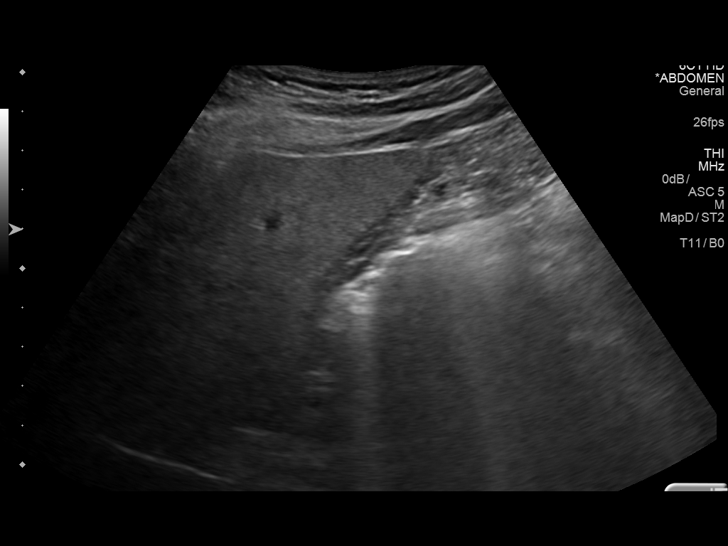
[im 26/41]
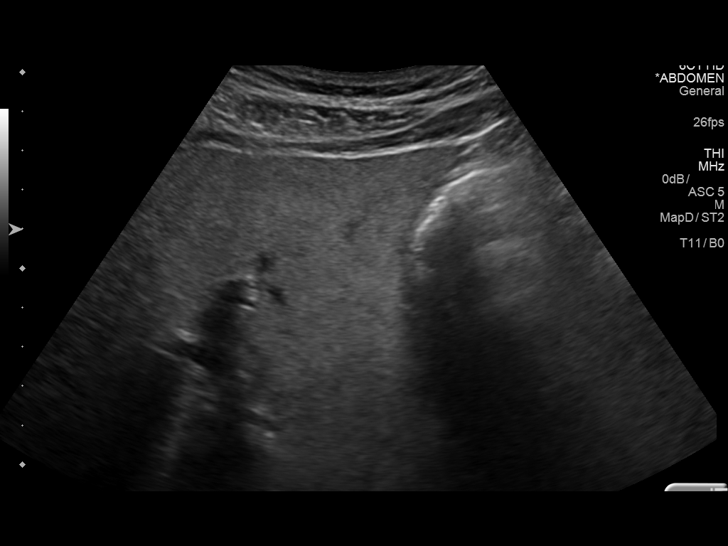
[im 27/41]
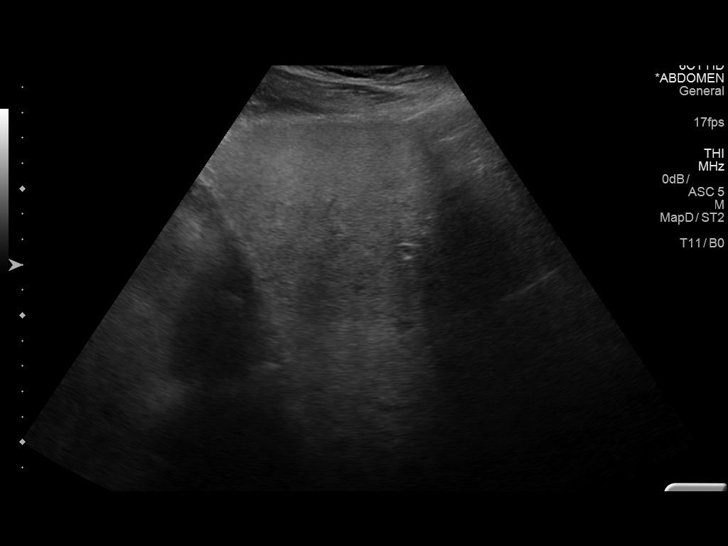
[im 31/41]
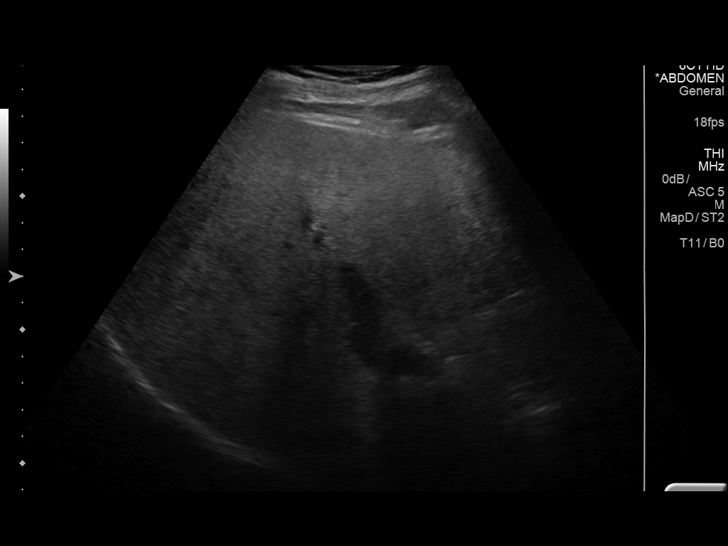
[im 34/41]
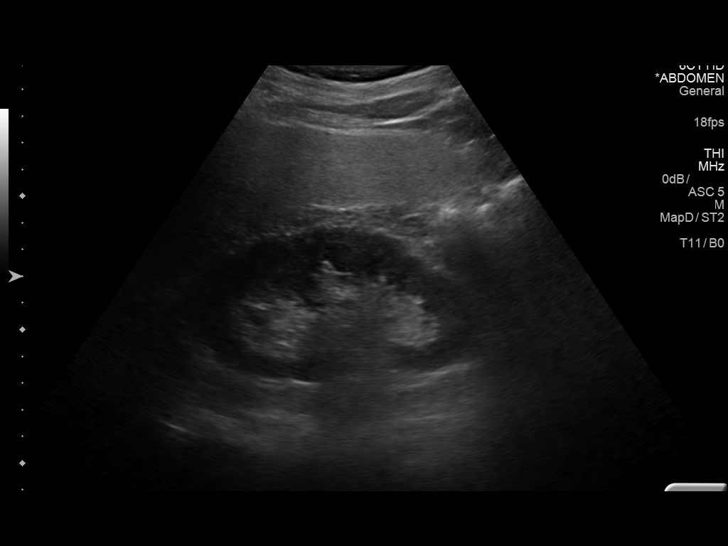
[im 37/41]
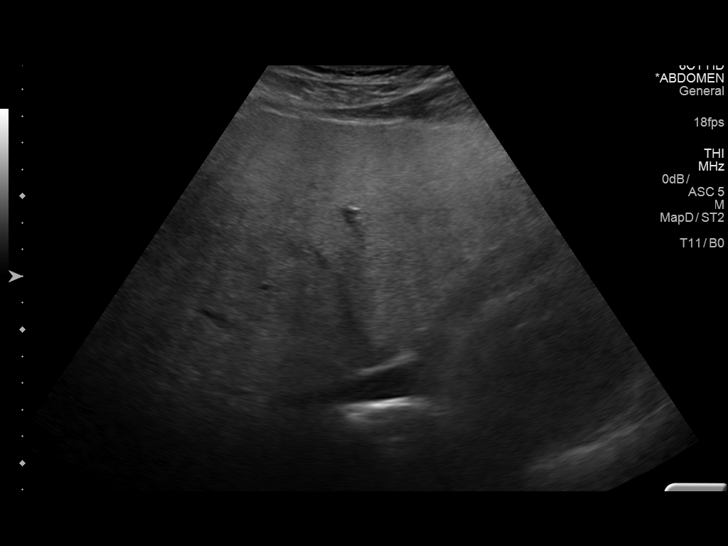
[im 41/41]
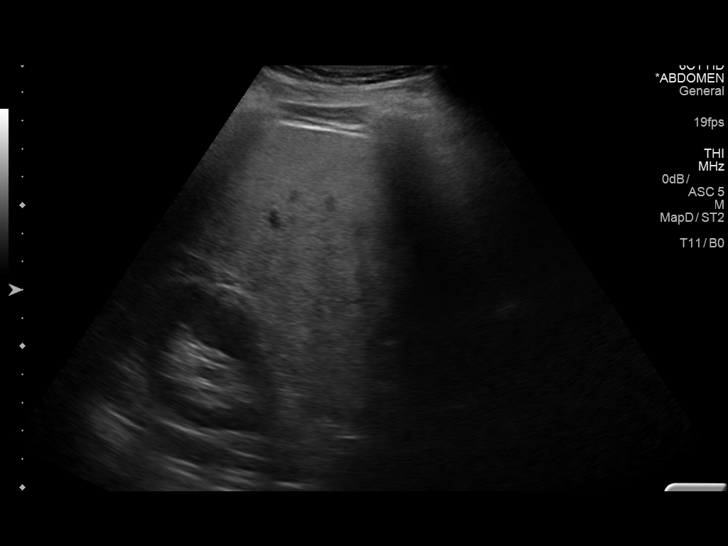

[14 of 25 positions shown; findings below may reference images not displayed]

FINDINGS: Gallbladder:

Normally distended without stones or wall thickening. No
pericholecystic fluid or sonographic Murphy sign.

Common bile duct:

Diameter: Normal caliber 4 mm diameter

Liver:

Echogenic parenchyma, likely fatty infiltration though this can be
seen with cirrhosis and certain infiltrative disorders. No focal
hepatic mass or nodularity. No intrahepatic biliary dilatation.
Portal vein is patent on color Doppler imaging with normal direction
of blood flow towards the liver.

No RIGHT upper quadrant free fluid.
IMPRESSION: Probable fatty infiltration of liver as above.

Otherwise negative exam.

## 2019-10-23 ENCOUNTER — Other Ambulatory Visit: Payer: Self-pay | Admitting: Osteopathic Medicine

## 2019-10-27 ENCOUNTER — Telehealth: Payer: Self-pay

## 2019-10-27 NOTE — Telephone Encounter (Signed)
Returned call to patient. Patient left message on Vm yesterday about stopping his cholesterol medication. We need clarification from patient to notify PCP. Left message on patient voicemail to return call to office.

## 2019-12-09 ENCOUNTER — Encounter: Payer: Self-pay | Admitting: Osteopathic Medicine

## 2019-12-09 ENCOUNTER — Other Ambulatory Visit: Payer: Self-pay

## 2019-12-09 ENCOUNTER — Ambulatory Visit (INDEPENDENT_AMBULATORY_CARE_PROVIDER_SITE_OTHER): Payer: Self-pay | Admitting: Osteopathic Medicine

## 2019-12-09 VITALS — BP 125/75 | HR 108 | Temp 97.0°F | Ht 67.0 in | Wt 218.0 lb

## 2019-12-09 DIAGNOSIS — K649 Unspecified hemorrhoids: Secondary | ICD-10-CM

## 2019-12-09 DIAGNOSIS — R609 Edema, unspecified: Secondary | ICD-10-CM

## 2019-12-09 DIAGNOSIS — I10 Essential (primary) hypertension: Secondary | ICD-10-CM

## 2019-12-09 MED ORDER — HYDROCORTISONE ACETATE 25 MG RE SUPP: 25.0000 mg | Freq: Two times a day (BID) | RECTAL | 2 refills | Status: DC | PRN

## 2019-12-09 MED ORDER — VALSARTAN 320 MG PO TABS: 320.0000 mg | ORAL_TABLET | Freq: Every day | ORAL | 3 refills | Status: DC

## 2019-12-09 NOTE — Progress Notes (Signed)
Brandon Conrad is a 64 y.o. male who presents to  [facility] at [facility]  today, 12/09/19, seeking care for the following:  . Leg swelling, bilateral edema, worse last 1-2 weeks but improved now though not totally resolved. Edema trace at ankles up to just distal to knee  . Hemorrhoid pain, also improving since he called about appointment      ASSESSMENT & PLAN with other pertinent findings:  The primary encounter diagnosis was Dependent edema. Diagnoses of Hypertension, unspecified type and Hemorrhoids, unspecified hemorrhoid type were also pertinent to this visit.      Patient Instructions  STOP amlodipine  Monitor BP at home MyChart will remind you in 2 weeks to message me w/ BP numbers  Call sooner if any other problems!   I sent Rx for hemorrhoids as needed    No orders of the defined types were placed in this encounter.   Meds ordered this encounter  Medications  . hydrocortisone (ANUSOL-HC) 25 MG suppository    Sig: Place 1 suppository (25 mg total) rectally 2 (two) times daily as needed for hemorrhoids.    Dispense:  12 suppository    Refill:  2  . valsartan (DIOVAN) 320 MG tablet    Sig: Take 1 tablet (320 mg total) by mouth daily.    Dispense:  90 tablet    Refill:  3       Follow-up instructions: Return if symptoms worsen or fail to improve.                                         BP 125/75 (BP Location: Right Arm, Patient Position: Sitting)   Pulse (!) 108   Temp (!) 97 F (36.1 C)   Ht 5\' 7"  (1.702 m)   Wt (!) 218 lb (98.9 kg)   SpO2 98%   BMI 34.14 kg/m   No outpatient medications have been marked as taking for the 12/09/19 encounter (Office Visit) with 12/11/19, DO.    No results found for this or any previous visit (from the past 72 hour(s)).  No results found.     All questions at time of visit were answered - patient instructed to contact  office with any additional concerns or updates.  ER/RTC precautions were reviewed with the patient as applicable.   Please note: voice recognition software was used to produce this document, and typos may escape review. Please contact Dr. Sunnie Nielsen for any needed clarifications

## 2019-12-09 NOTE — Patient Instructions (Addendum)
STOP amlodipine  Monitor BP at home MyChart will remind you in 2 weeks to message me w/ BP numbers  Call sooner if any other problems!   I sent Rx for hemorrhoids as needed

## 2019-12-10 ENCOUNTER — Encounter: Payer: Self-pay | Admitting: Osteopathic Medicine

## 2020-01-09 ENCOUNTER — Other Ambulatory Visit: Payer: Self-pay | Admitting: Osteopathic Medicine

## 2020-01-17 ENCOUNTER — Other Ambulatory Visit: Payer: Self-pay | Admitting: Osteopathic Medicine

## 2020-03-17 ENCOUNTER — Other Ambulatory Visit: Payer: Self-pay | Admitting: Osteopathic Medicine

## 2020-07-17 ENCOUNTER — Other Ambulatory Visit: Payer: Self-pay | Admitting: Osteopathic Medicine

## 2020-09-19 ENCOUNTER — Other Ambulatory Visit: Payer: Self-pay | Admitting: Osteopathic Medicine

## 2020-10-25 ENCOUNTER — Other Ambulatory Visit: Payer: Self-pay | Admitting: Osteopathic Medicine

## 2020-12-06 ENCOUNTER — Encounter: Payer: Self-pay | Admitting: Medical-Surgical

## 2020-12-06 ENCOUNTER — Ambulatory Visit (INDEPENDENT_AMBULATORY_CARE_PROVIDER_SITE_OTHER): Payer: BC Managed Care – PPO | Admitting: Medical-Surgical

## 2020-12-06 ENCOUNTER — Other Ambulatory Visit: Payer: Self-pay

## 2020-12-06 VITALS — BP 133/92 | HR 105 | Resp 20 | Wt 220.0 lb

## 2020-12-06 DIAGNOSIS — R252 Cramp and spasm: Secondary | ICD-10-CM

## 2020-12-06 DIAGNOSIS — Z131 Encounter for screening for diabetes mellitus: Secondary | ICD-10-CM

## 2020-12-06 DIAGNOSIS — R7303 Prediabetes: Secondary | ICD-10-CM | POA: Diagnosis not present

## 2020-12-06 DIAGNOSIS — R253 Fasciculation: Secondary | ICD-10-CM

## 2020-12-06 DIAGNOSIS — Z1329 Encounter for screening for other suspected endocrine disorder: Secondary | ICD-10-CM

## 2020-12-06 DIAGNOSIS — R202 Paresthesia of skin: Secondary | ICD-10-CM | POA: Diagnosis not present

## 2020-12-06 DIAGNOSIS — R200 Anesthesia of skin: Secondary | ICD-10-CM

## 2020-12-06 DIAGNOSIS — E559 Vitamin D deficiency, unspecified: Secondary | ICD-10-CM | POA: Diagnosis not present

## 2020-12-06 DIAGNOSIS — I872 Venous insufficiency (chronic) (peripheral): Secondary | ICD-10-CM

## 2020-12-06 NOTE — Progress Notes (Signed)
HPI with pertinent ROS:   CC: Muscle cramps and twitches  HPI: Pleasant 65 year old male presenting today for complaints of about 1 month of muscle twitching in his extremities and face as well as cramps in his legs.  He has had some intermittent paresthesias involving his lip as well other side of his face, he has also had some weakness in his arms.  This morning he noted waking up and the right side of his face was numb.  This feeling only lasted for few minutes and was not accompanied by any difficulty with speech, mental changes, extremity weakness, dizziness, or headache.  On the way to the doctor's office, he did note that his lower left lip began to twitch and was somewhat numb for about 15 to 20 minutes.  This is now resolved.  Notices cramps in his lower legs are worse in the mornings when he is trying to get up.  He notes he has gained weight since he quit smoking a year and a half ago and has plans to lose approximately 40 pounds.  He does have a history of muscle cramps for which he took potassium chloride.  This was helpful but he only took it until he felt better.   Blood pressure is slightly elevated diastolically today.  He does take amlodipine 10 mg daily, valsartan 320 mg daily and hydrochlorothiazide 25 mg daily.  Tolerates these medications well without side effects.  Endorses some lower extremity edema and does have a history of enlarged veins in the lower extremities.  Has some discoloration on both feet around the ankles and along the top of his toes.  The discoloration is not itchy and is not bothersome outside of cosmetically speaking.  I reviewed the past medical history, family history, social history, surgical history, and allergies today and no changes were needed.  Please see the problem list section below in epic for further details.   Physical exam:   General: Well Developed, well nourished, and in no acute distress.  Neuro: Alert and oriented x3 HEENT:  Normocephalic, atraumatic.  Skin: Warm and dry.  Light brown speckled discoloration to bilateral feet along the top of the toes as well as around both ankles. Cardiac: Regular rate and rhythm, no murmurs rubs or gallops, +1 lower extremity edema bilaterally with some venous dilation noted.  Respiratory: Clear to auscultation bilaterally. Not using accessory muscles, speaking in full sentences.  Impression and Recommendations:    1. Muscle cramps 2. Muscle twitching 3. Paresthesia Unclear etiology.  We will go ahead and check some blood work today as listed below.  Consider possible electrolyte imbalance versus dehydration.  No other symptoms associated to indicate concern for acute stroke. - CBC with Differential/Platelet - COMPLETE METABOLIC PANEL WITH GFR - Lipid panel - TSH - Magnesium - VITAMIN D 25 Hydroxy (Vit-D Deficiency, Fractures) - Hemoglobin A1c - Vitamin B12  4. Thyroid disorder screen Checking thyroid function today. - TSH  5. Diabetes mellitus screening 6. Prediabetes Checking hemoglobin A1c today. - Hemoglobin A1c  7.  Venous insufficiency Suspect lower extremity swelling is more related to venous insufficiency.  Discoloration of bilateral feet and ankles can likely be attributed to this.  Alternatively, this could be related to his high dose of amlodipine.  Given that his symptoms are mild, recommend low-sodium diet, maintaining good hydration, and compression socks during the day.    Return if symptoms worsen or fail to improve. ___________________________________________ Thayer Ohm, DNP, APRN, FNP-BC Primary Care and Sports Medicine  [place]

## 2020-12-06 NOTE — Progress Notes (Signed)
Hey! The only other thought I had for this patient is this could possibly be an MS type presentation? If all labs totally normal would get MRI brain just to be safe but let's see what labs show first

## 2020-12-07 LAB — TSH: TSH: 1.8 mIU/L (ref 0.40–4.50)

## 2020-12-07 LAB — CBC WITH DIFFERENTIAL/PLATELET
Absolute Monocytes: 685 cells/uL (ref 200–950)
Basophils Absolute: 62 cells/uL (ref 0–200)
Basophils Relative: 0.8 %
Eosinophils Absolute: 323 cells/uL (ref 15–500)
Eosinophils Relative: 4.2 %
HCT: 48.1 % (ref 38.5–50.0)
Hemoglobin: 16.2 g/dL (ref 13.2–17.1)
Lymphs Abs: 2210 cells/uL (ref 850–3900)
MCH: 32.8 pg (ref 27.0–33.0)
MCHC: 33.7 g/dL (ref 32.0–36.0)
MCV: 97.4 fL (ref 80.0–100.0)
MPV: 12.7 fL — ABNORMAL HIGH (ref 7.5–12.5)
Monocytes Relative: 8.9 %
Neutro Abs: 4420 cells/uL (ref 1500–7800)
Neutrophils Relative %: 57.4 %
Platelets: 250 10*3/uL (ref 140–400)
RBC: 4.94 10*6/uL (ref 4.20–5.80)
RDW: 12.9 % (ref 11.0–15.0)
Total Lymphocyte: 28.7 %
WBC: 7.7 10*3/uL (ref 3.8–10.8)

## 2020-12-07 LAB — COMPLETE METABOLIC PANEL WITH GFR
AG Ratio: 1.9 (calc) (ref 1.0–2.5)
ALT: 87 U/L — ABNORMAL HIGH (ref 9–46)
AST: 64 U/L — ABNORMAL HIGH (ref 10–35)
Albumin: 4.7 g/dL (ref 3.6–5.1)
Alkaline phosphatase (APISO): 49 U/L (ref 35–144)
BUN: 18 mg/dL (ref 7–25)
CO2: 28 mmol/L (ref 20–32)
Calcium: 9.9 mg/dL (ref 8.6–10.3)
Chloride: 95 mmol/L — ABNORMAL LOW (ref 98–110)
Creat: 1.09 mg/dL (ref 0.70–1.35)
Globulin: 2.5 g/dL (calc) (ref 1.9–3.7)
Glucose, Bld: 139 mg/dL — ABNORMAL HIGH (ref 65–99)
Potassium: 4.1 mmol/L (ref 3.5–5.3)
Sodium: 135 mmol/L (ref 135–146)
Total Bilirubin: 0.9 mg/dL (ref 0.2–1.2)
Total Protein: 7.2 g/dL (ref 6.1–8.1)
eGFR: 77 mL/min/{1.73_m2} (ref >=60)

## 2020-12-07 LAB — LIPID PANEL
Cholesterol: 304 mg/dL — ABNORMAL HIGH (ref <200)
HDL: 37 mg/dL — ABNORMAL LOW (ref >=40)
Non-HDL Cholesterol (Calc): 267 mg/dL (calc) — ABNORMAL HIGH (ref <130)
Total CHOL/HDL Ratio: 8.2 (calc) — ABNORMAL HIGH (ref <5.0)
Triglycerides: 1176 mg/dL — ABNORMAL HIGH (ref <150)

## 2020-12-07 LAB — MAGNESIUM: Magnesium: 1.5 mg/dL (ref 1.5–2.5)

## 2020-12-07 LAB — VITAMIN B12: Vitamin B-12: 308 pg/mL (ref 200–1100)

## 2020-12-07 LAB — HEMOGLOBIN A1C
Hgb A1c MFr Bld: 7.1 % of total Hgb — ABNORMAL HIGH (ref <5.7)
Mean Plasma Glucose: 157 mg/dL
eAG (mmol/L): 8.7 mmol/L

## 2020-12-07 LAB — VITAMIN D 25 HYDROXY (VIT D DEFICIENCY, FRACTURES): Vit D, 25-Hydroxy: 11 ng/mL — ABNORMAL LOW (ref 30–100)

## 2020-12-07 MED ORDER — OMEGA-3-ACID ETHYL ESTERS 1 G PO CAPS: 2.0000 g | ORAL_CAPSULE | Freq: Two times a day (BID) | ORAL | 3 refills | Status: DC

## 2020-12-07 MED ORDER — ATORVASTATIN CALCIUM 20 MG PO TABS: 20.0000 mg | ORAL_TABLET | Freq: Every day | ORAL | 3 refills | Status: DC

## 2020-12-07 MED ORDER — METFORMIN HCL ER 500 MG PO TB24: 500.0000 mg | ORAL_TABLET | Freq: Every day | ORAL | 11 refills | Status: DC

## 2020-12-07 NOTE — Addendum Note (Signed)
Addended byChristen Butter on: 12/07/2020 08:22 AM   Modules accepted: Orders

## 2020-12-14 ENCOUNTER — Ambulatory Visit: Payer: BC Managed Care – PPO | Admitting: Osteopathic Medicine

## 2020-12-14 ENCOUNTER — Other Ambulatory Visit: Payer: Self-pay

## 2020-12-14 ENCOUNTER — Encounter: Payer: Self-pay | Admitting: Osteopathic Medicine

## 2020-12-14 VITALS — BP 110/73 | HR 102 | Temp 98.6°F | Wt 217.0 lb

## 2020-12-14 DIAGNOSIS — I10 Essential (primary) hypertension: Secondary | ICD-10-CM | POA: Diagnosis not present

## 2020-12-14 DIAGNOSIS — E781 Pure hyperglyceridemia: Secondary | ICD-10-CM | POA: Diagnosis not present

## 2020-12-14 NOTE — Patient Instructions (Signed)
Plan: Labs in 4 weeks Visit w/ me few days after that to check in )can count this as annual physical if otherwise doing ok)  Let us know sooner if any questions/concerns!

## 2020-12-14 NOTE — Progress Notes (Signed)
Brandon Conrad is a 65 y.o. male who presents to  [place] at [facility]  today, 12/14/20, seeking care for the following:  DM2 follow-up: recent A1C 7.1 Vitamin D low at 11, high dose supplementation sent in  TG significantly high >1000 AST/ALT elevated but nor severe. RUQ [place] 05/2018 (+)fatty liver Overall doing well at this time! He's really worked hard on healthier diet/exercise and is feeling improved compared to his visit w/ [facility]. He'd like to defer MRI brain at this time      ASSESSMENT & PLAN with other pertinent findings:  The primary encounter diagnosis was Essential hypertension. A diagnosis of Hypertriglyceridemia was also pertinent to this visit.    Patient Instructions  Plan: Labs in 4 weeks Visit w/ me few days after that to check in )can count this as annual physical if otherwise doing ok)  Let us know sooner if any questions/concerns!   Orders Placed This Encounter  Procedures   Lipid Panel w/reflex Direct LDL   COMPLETE METABOLIC PANEL WITH GFR    No orders of the defined types were placed in this encounter.    See below for relevant physical exam findings  See below for recent lab and imaging results reviewed  Medications, allergies, PMH, PSH, SocH, [place] reviewed below    Follow-up instructions: Return in about 4 weeks (around 01/11/2021) for [place] (labs 2+ days prior to appt, orders are in).                                        Exam:  BP 110/73 (BP Location: Left Arm, Patient Position: Sitting, Cuff Size: Large)   Pulse (!) 102   Temp 98.6 F (37 C) (Oral)   Wt 217 lb 0.6 oz (98.4 kg)   BMI 33.99 kg/m  Constitutional: VS see above. General Appearance: alert, well-developed, well-nourished, NAD Neck: No masses, trachea midline.  Respiratory: Normal respiratory effort. no wheeze, no rhonchi, no rales Cardiovascular: S1/S2 normal, no murmur, no rub/gallop  auscultated. RRR.  Musculoskeletal: Gait normal. Symmetric and independent movement of all extremities Neurological: Normal balance/coordination. No tremor. Skin: warm, dry, intact.  Psychiatric: Normal judgment/insight. Normal mood and affect. Oriented x3.   Current Meds  Medication Sig   atorvastatin (LIPITOR) 20 MG tablet Take 1 tablet (20 mg total) by mouth daily.   cyclobenzaprine (FLEXERIL) 10 MG tablet TAKE 0.5-1 TABLETS (5-10 MG TOTAL) BY MOUTH 3 (THREE) TIMES DAILY AS NEEDED FOR MUSCLE SPASMS. CAUTION: CAN CAUSE DROWSINESS   hydrochlorothiazide (HYDRODIURIL) 25 MG tablet TAKE 1 TABLET BY MOUTH EVERY DAY   metFORMIN (GLUCOPHAGE XR) 500 MG 24 hr tablet Take 1 tablet (500 mg total) by mouth daily with breakfast.   omega-3 acid ethyl esters (LOVAZA) 1 g capsule Take 2 capsules (2 g total) by mouth 2 (two) times daily.   valsartan (DIOVAN) 320 MG tablet Take 1 tablet (320 mg total) by mouth daily.   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  Social History   Tobacco Use  Smoking Status Former   Packs/day: 0.25   Types: Cigarettes   Quit date: 02/16/2015   Years since quitting: 5.8  Smokeless Tobacco Never  Tobacco Comments   currently smoking 3 - 4 cigs/day    Past Surgical History:  Procedure Laterality Date   APPENDECTOMY  2011    Immunization History  Administered Date(s) Administered   Tdap 09/11/2017    Recent Results (from the past 2160 hour(s))   CBC with Differential/Platelet     Status: Abnormal   Collection Time: 12/06/20 12:00 AM  Result Value Ref Range   WBC 7.7 3.8 - 10.8 Thousand/uL   RBC 4.94 4.20 - 5.80 Million/uL   Hemoglobin 16.2 13.2 - 17.1 g/dL   HCT 48.1 38.5 - 50.0 %   MCV 97.4 80.0 - 100.0 fL   MCH 32.8 27.0 - 33.0 pg   MCHC 33.7 32.0 - 36.0 g/dL   RDW 12.9 11.0 - 15.0 %   Platelets 250 140 - 400 Thousand/uL   MPV 12.7 (H) 7.5 - 12.5 fL   Neutro Abs 4,420 1,500 - 7,800 cells/uL   Lymphs Abs 2,210 850 - 3,900 cells/uL   Absolute Monocytes 685 200 - 950 cells/uL   Eosinophils Absolute 323 15 - 500 cells/uL   Basophils Absolute 62 0 - 200 cells/uL   Neutrophils Relative % 57.4 %   Total Lymphocyte 28.7 %   Monocytes Relative 8.9 %   Eosinophils Relative 4.2 %   Basophils Relative 0.8 %  COMPLETE METABOLIC PANEL WITH GFR     Status: Abnormal   Collection Time: 12/06/20 12:00 AM  Result Value Ref Range   Glucose, Bld 139 (H) 65 - 99 mg/dL    Comment: .            Fasting reference interval . For someone without known diabetes, a glucose value >125 mg/dL indicates that they may have diabetes and this should be confirmed with a follow-up test. .    BUN 18 7 - 25 mg/dL   Creat 1.09 0.70 - 1.35 mg/dL   eGFR 77 > OR = 60 mL/min/1.88m    Comment: The eGFR is based on the CKD-EPI 2021 equation. To calculate  the new eGFR from a previous Creatinine or Cystatin C result, go to https://www.kidney.org/professionals/ kdoqi/gfr%5Fcalculator    BUN/Creatinine Ratio NOT APPLICABLE 6 - 22 (calc)   Sodium 135 135 - 146 mmol/L   Potassium 4.1 3.5 - 5.3 mmol/L   Chloride 95 (L) 98 - 110 mmol/L   CO2 28 20 - 32 mmol/L   Calcium 9.9 8.6 - 10.3 mg/dL   Total Protein 7.2 6.1 - 8.1 g/dL   Albumin 4.7 3.6 - 5.1 g/dL   Globulin 2.5 1.9 - 3.7 g/dL (calc)   AG Ratio 1.9 1.0 - 2.5 (calc)   Total Bilirubin 0.9 0.2 - 1.2 mg/dL   Alkaline phosphatase (APISO) 49 35 - 144 U/L   AST 64 (H) 10 - 35 U/L   ALT 87 (H) 9 - 46  U/L  Lipid panel     Status: Abnormal   Collection Time: 12/06/20 12:00 AM  Result Value Ref Range   Cholesterol 304 (H) <200 mg/dL   HDL 37 (L) > OR = 40 mg/dL   Triglycerides 1,176 (H) <150 mg/dL    Comment: Verified by repeat analysis. .Marland Kitchen.Marland Kitchen  If a non-fasting specimen was collected, consider repeat triglyceride testing on a fasting specimen if clinically indicated.  Yates Decamp et al. J. of Clin. Lipidol. 6384;5:364-680. . . There is increased risk of pancreatitis when the  triglyceride concentration is very high  (> or = 500 mg/dL, especially if > or = 1000 mg/dL).  Yates Decamp et al. J. of Clin. Lipidol. 3212;2:482-500. Marland Kitchen    LDL Cholesterol (Calc)  mg/dL (calc)    Comment: . LDL cholesterol not calculated. Triglyceride levels greater than 400 mg/dL invalidate calculated LDL results. . Reference range: <100 . Desirable range <100 mg/dL for primary prevention;   <70 mg/dL for patients with CHD or diabetic patients  with > or = 2 CHD risk factors. Marland Kitchen LDL-C is now calculated using the Martin-Hopkins  calculation, which is a validated novel method providing  better accuracy than the Friedewald equation in the  estimation of LDL-C.  Cresenciano Genre et al. Annamaria Helling. 3704;888(91): 2061-2068  (http://education.[facility].com/faq/FAQ164)    Total CHOL/HDL Ratio 8.2 (H) <5.0 (calc)   Non-HDL Cholesterol (Calc) 267 (H) <130 mg/dL (calc)    Comment: Non-HDL level > or = 220 is very high and may indicate  genetic familial hypercholesterolemia (FH). Clinical  assessment and measurement of blood lipid levels  should be considered for all first-degree relatives  of patients with an FH diagnosis. . For patients with diabetes plus 1 major ASCVD risk  factor, treating to a non-HDL-C goal of <100 mg/dL  (LDL-C of <70 mg/dL) is considered a therapeutic  option.   TSH     Status: None   Collection Time: 12/06/20 12:00 AM  Result Value Ref Range   TSH 1.80 0.40 - 4.50 mIU/L  Magnesium      Status: None   Collection Time: 12/06/20 12:00 AM  Result Value Ref Range   Magnesium 1.5 1.5 - 2.5 mg/dL  VITAMIN D 25 Hydroxy (Vit-D Deficiency, Fractures)     Status: Abnormal   Collection Time: 12/06/20 12:00 AM  Result Value Ref Range   Vit D, 25-Hydroxy 11 (L) 30 - 100 ng/mL    Comment: Vitamin D Status         25-OH Vitamin D: . Deficiency:                    <20 ng/mL Insufficiency:             20 - 29 ng/mL Optimal:                 > or = 30 ng/mL . For 25-OH Vitamin D testing on patients on  D2-supplementation and patients for whom quantitation  of D2 and D3 fractions is required, the QuestAssureD(TM) 25-OH VIT D, (D2,D3), LC/MS/MS is recommended: order  code [id number] (patients >59yr). See Note 1 . Note 1 . For additional information, please refer to  http://education.[facility].com/faq/FAQ199  (This link is being provided for informational/ educational purposes only.)   Hemoglobin A1c     Status: Abnormal   Collection Time: 12/06/20 12:00 AM  Result Value Ref Range   Hgb A1c MFr Bld 7.1 (H) <5.7 % of total Hgb    Comment: For someone without known diabetes, a hemoglobin A1c value of 6.5% or greater indicates that they may have  diabetes and this should be confirmed with a follow-up  test. . For someone with known diabetes, a value <7% indicates  that their diabetes is well controlled and a value  greater than or equal to 7% indicates suboptimal  control. A1c targets should be individualized based on  duration of diabetes, age, comorbid conditions, and  other considerations. . Currently, no consensus exists regarding use of hemoglobin A1c for diagnosis of diabetes for children. .    Mean Plasma Glucose 157 mg/dL   eAG (mmol/L) 8.7 mmol/L  Vitamin B12     Status: None   Collection Time: 12/06/20 12:00 AM  Result Value Ref Range   Vitamin B-12 308 200 - 1,100 pg/mL    Comment: . Please Note: Although the reference range for vitamin B12 is [phone]  pg/mL, it has been reported that between 5 and 10% of patients with values between 200 and 400 pg/mL may experience neuropsychiatric and hematologic abnormalities due to occult B12 deficiency; less than 1% of patients with values above 400 pg/mL will have symptoms. .     No results found.     All questions at time of visit were answered - patient instructed to contact office with any additional concerns or updates. ER/RTC precautions were reviewed with the patient as applicable.   Please note: manual typing as well as voice recognition software may have been used to produce this document - typos may escape review. Please contact Dr. Sheppard Coil for any needed clarifications.   Total encounter time on date of service, 12/14/20, was 20 minutes spent addressing problems/issues as noted above in [place], including time spent in discussion with patient regarding the HPI, ROS, confirming history, reviewing Assessment & Plan, as well as time spent on coordination of care, record review.

## 2020-12-24 ENCOUNTER — Other Ambulatory Visit: Payer: Self-pay | Admitting: Osteopathic Medicine

## 2020-12-26 ENCOUNTER — Other Ambulatory Visit: Payer: Self-pay | Admitting: Osteopathic Medicine

## 2020-12-27 ENCOUNTER — Other Ambulatory Visit: Payer: Self-pay | Admitting: Osteopathic Medicine

## 2021-01-12 ENCOUNTER — Ambulatory Visit (INDEPENDENT_AMBULATORY_CARE_PROVIDER_SITE_OTHER): Payer: BC Managed Care – PPO | Admitting: Osteopathic Medicine

## 2021-01-12 ENCOUNTER — Encounter: Payer: Self-pay | Admitting: Osteopathic Medicine

## 2021-01-12 ENCOUNTER — Other Ambulatory Visit: Payer: Self-pay

## 2021-01-12 VITALS — BP 137/92 | HR 103 | Temp 97.9°F | Wt 219.1 lb

## 2021-01-12 DIAGNOSIS — E781 Pure hyperglyceridemia: Secondary | ICD-10-CM

## 2021-01-12 DIAGNOSIS — Z0000 Encounter for general adult medical examination without abnormal findings: Secondary | ICD-10-CM | POA: Diagnosis not present

## 2021-01-12 DIAGNOSIS — I10 Essential (primary) hypertension: Secondary | ICD-10-CM | POA: Diagnosis not present

## 2021-01-12 LAB — LIPID PANEL W/REFLEX DIRECT LDL
Cholesterol: 190 mg/dL (ref <200)
HDL: 36 mg/dL — ABNORMAL LOW (ref >=40)
Non-HDL Cholesterol (Calc): 154 mg/dL (calc) — ABNORMAL HIGH (ref <130)
Total CHOL/HDL Ratio: 5.3 (calc) — ABNORMAL HIGH (ref <5.0)
Triglycerides: 737 mg/dL — ABNORMAL HIGH (ref <150)

## 2021-01-12 LAB — COMPLETE METABOLIC PANEL WITH GFR
AG Ratio: 2 (calc) (ref 1.0–2.5)
ALT: 71 U/L — ABNORMAL HIGH (ref 9–46)
AST: 40 U/L — ABNORMAL HIGH (ref 10–35)
Albumin: 4.5 g/dL (ref 3.6–5.1)
Alkaline phosphatase (APISO): 50 U/L (ref 35–144)
BUN: 21 mg/dL (ref 7–25)
CO2: 28 mmol/L (ref 20–32)
Calcium: 9.4 mg/dL (ref 8.6–10.3)
Chloride: 99 mmol/L (ref 98–110)
Creat: 1.17 mg/dL (ref 0.70–1.35)
Globulin: 2.2 g/dL (calc) (ref 1.9–3.7)
Glucose, Bld: 149 mg/dL — ABNORMAL HIGH (ref 65–99)
Potassium: 3.5 mmol/L (ref 3.5–5.3)
Sodium: 138 mmol/L (ref 135–146)
Total Bilirubin: 0.6 mg/dL (ref 0.2–1.2)
Total Protein: 6.7 g/dL (ref 6.1–8.1)
eGFR: 70 mL/min/{1.73_m2} (ref >=60)

## 2021-01-12 LAB — DIRECT LDL: Direct LDL: 81 mg/dL (ref <100)

## 2021-01-12 MED ORDER — FENOFIBRATE 160 MG PO TABS: 160.0000 mg | ORAL_TABLET | Freq: Every day | ORAL | 1 refills | Status: DC

## 2021-01-12 MED ORDER — OMEPRAZOLE 40 MG PO CPDR: DELAYED_RELEASE_CAPSULE | ORAL | 3 refills | Status: DC

## 2021-01-12 MED ORDER — ATORVASTATIN CALCIUM 20 MG PO TABS: 10.0000 mg | ORAL_TABLET | Freq: Every day | ORAL | 3 refills | Status: DC

## 2021-01-12 MED ORDER — CYCLOBENZAPRINE HCL 10 MG PO TABS: 10.0000 mg | ORAL_TABLET | Freq: Four times a day (QID) | ORAL | 0 refills | Status: DC | PRN

## 2021-01-12 MED ORDER — COENZYME Q10 30 MG PO CAPS: 60.0000 mg | ORAL_CAPSULE | Freq: Every day | ORAL | 1 refills | Status: DC

## 2021-01-12 NOTE — Patient Instructions (Addendum)
General Preventive Care Most recent routine screening labs: see attached.  Blood pressure goal 130/80 or less.  Tobacco: don't!  Alcohol: responsible moderation is ok for most adults - if you have concerns about your alcohol intake, please talk to me!  Exercise: as tolerated to reduce risk of cardiovascular disease and diabetes. Strength training will also prevent osteoporosis.  Mental health: if need for mental health care (medicines, counseling, other), or concerns about moods, please let me know!  Sexual / Reproductive health: if need for STD testing, or if concerns with libido/pain problems, please let me know! If you need to discuss family planning, please let me know! Advanced Directive: Living Will and/or Healthcare Power of Attorney recommended for all adults, regardless of age or health.  Vaccines Flu vaccine: for almost everyone, every fall.  Shingles vaccine: after age 39.  Pneumonia vaccines: after age 9, or sooner if certain medical conditions. Tetanus booster: every 10 years - due 2029 COVID vaccine: STRONGLY RECOMMENDED  Cancer screenings  Colon cancer screening: for everyone age 63-75. Colonoscopy available for all, many people also qualify for the Cologuard stool test.  Prostate cancer screening: PSA blood test age 46-71.  Lung cancer screening: CT chest every year for those aged 56 to 80 years who have a 20 pack-year smoking history and currently smoke or have quit within the past 15 years  Infection screenings  HIV: recommended screening at least once age 93-65, more often as needed. Gonorrhea/Chlamydia: screening as needed Hepatitis C: recommended once for everyone age 13-75 TB: certain at-risk populations Other Bone Density Test: recommended for men at age 46 Abdominal Aortic Aneurysm: screening with ultrasound recommended once for men age 36-75 who have ever smoked

## 2021-01-12 NOTE — Progress Notes (Signed)
Brandon Conrad is a 65 y.o. male who presents to  [facility]  today, 01/12/21, seeking care for the following:  Annual physical  BP recheck Labs recheck - very high TG on recent labs, see results      ASSESSMENT & PLAN with other pertinent findings:  The primary encounter diagnosis was Annual physical exam. Diagnoses of Essential hypertension and Hypertriglyceridemia were also pertinent to this visit.   1. Annual physical exam See below Declined vaccines  Declined colon cancer screening   2. Essential hypertension BP Readings from Last 3 Encounters:  01/12/21 (!) 137/92  12/14/20 110/73  12/06/20 (!) 133/92  Above goal, pt counseled on <130/80 goal   3. Hypertriglyceridemia TG improved on Lovaza but still quite high. Discussed diet modifications. Pt concerned about statin myopathy, ok to reduce atorvastatin and start fenofibrate, advised OTC CoQ10 as well. Recheck in 3 mos w/ Dr Rodena Piety    Patient Instructions  General Preventive Care Most recent routine screening labs: see attached.  Blood pressure goal 130/80 or less.  Tobacco: don't!  Alcohol: responsible moderation is ok for most adults - if you have concerns about your alcohol intake, please talk to me!  Exercise: as tolerated to reduce risk of cardiovascular disease and diabetes. Strength training will also prevent osteoporosis.  Mental health: if need for mental health care (medicines, counseling, other), or concerns about moods, please let me know!  Sexual / Reproductive health: if need for STD testing, or if concerns with libido/pain problems, please let me know! If you need to discuss family planning, please let me know! Advanced Directive: Living Will and/or Healthcare Power of Attorney recommended for all adults, regardless of age or health.  Vaccines Flu vaccine: for almost everyone, every fall.  Shingles vaccine: after age 51.  Pneumonia vaccines: after  age 77, or sooner if certain medical conditions. Tetanus booster: every 10 years - due 2029 COVID vaccine: STRONGLY RECOMMENDED  Cancer screenings  Colon cancer screening: for everyone age 84-75. Colonoscopy available for all, many people also qualify for the Cologuard stool test.  Prostate cancer screening: PSA blood test age 48-71.  Lung cancer screening: CT chest every year for those aged 10 to 24 years who have a 20 pack-year smoking history and currently smoke or have quit within the past 15 years  Infection screenings  HIV: recommended screening at least once age 14-65, more often as needed. Gonorrhea/Chlamydia: screening as needed Hepatitis C: recommended once for everyone age 97-53 TB: certain at-risk populations Other Bone Density Test: recommended for men at age 34 Abdominal Aortic Aneurysm: screening with ultrasound recommended once for men age 64-75 who have ever smoked   Orders Placed This Encounter  Procedures   Lipid Panel w/reflex Direct LDL   COMPLETE METABOLIC PANEL WITH GFR     Meds ordered this encounter  Medications   atorvastatin (LIPITOR) 20 MG tablet    Sig: Take 0.5 tablets (10 mg total) by mouth daily.    Dispense:  90 tablet    Refill:  3   fenofibrate 160 MG tablet    Sig: Take 1 tablet (160 mg total) by mouth daily.    Dispense:  90 tablet    Refill:  1   cyclobenzaprine (FLEXERIL) 10 MG tablet    Sig: Take 1 tablet (10 mg total) by mouth 4 (four) times daily as needed for muscle spasms.    Dispense:  120 tablet    Refill:  0  omeprazole (PRILOSEC) 40 MG capsule    Sig: TAKE 1 CAPSULE BY MOUTH EVERY DAY    Dispense:  90 capsule    Refill:  3   co-enzyme Q-10 30 MG capsule    Sig: Take 2 capsules (60 mg total) by mouth daily.    Dispense:  180 capsule    Refill:  1      See below for relevant physical exam findings  See below for recent lab and imaging results reviewed  Medications, allergies, PMH, PSH, SocH, FamH reviewed  below    Follow-up instructions: Return in about 3 months (around 04/13/2021) for MONITOR LABS : CHOLESTEROL W/ DR [place] .                                        Exam:  BP (!) 137/92 (BP Location: Left Arm, Patient Position: Sitting, Cuff Size: Large)   Pulse (!) 103   Temp 97.9 F (36.6 C) (Oral)   Wt 219 lb 1.9 oz (99.4 kg)   BMI 34.32 kg/m  Constitutional: VS see above. General Appearance: alert, well-developed, well-nourished, NAD Neck: No masses, trachea midline.  Respiratory: Normal respiratory effort. no wheeze, no rhonchi, no rales Cardiovascular: S1/S2 normal, no murmur, no rub/gallop auscultated. RRR.  Musculoskeletal: Gait normal. Symmetric and independent movement of all extremities Abdominal: non-tender, non-distended, no appreciable organomegaly, neg Murphy's, BS [id number] Neurological: Normal balance/coordination. No tremor. Skin: warm, dry, intact.  Psychiatric: Normal judgment/insight. Normal mood and affect. Oriented x3.   Current Meds  Medication Sig   co-enzyme Q-10 30 MG capsule Take 2 capsules (60 mg total) by mouth daily.   fenofibrate 160 MG tablet Take 1 tablet (160 mg total) by mouth daily.   hydrochlorothiazide (HYDRODIURIL) 25 MG tablet TAKE 1 TABLET BY MOUTH EVERY DAY   metFORMIN (GLUCOPHAGE XR) 500 MG 24 hr tablet Take 1 tablet (500 mg total) by mouth daily with breakfast.   omega-3 acid ethyl esters (LOVAZA) 1 g capsule Take 2 capsules (2 g total) by mouth 2 (two) times daily.   valsartan (DIOVAN) 320 MG tablet TAKE 1 TABLET BY MOUTH EVERY DAY   [DISCONTINUED] atorvastatin (LIPITOR) 20 MG tablet Take 1 tablet (20 mg total) by mouth daily.   [DISCONTINUED] cyclobenzaprine (FLEXERIL) 10 MG tablet TAKE 0.5-1 TABLETS (5-10 MG TOTAL) BY MOUTH 3 (THREE) TIMES DAILY AS NEEDED FOR MUSCLE SPASMS. 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  Social History   Tobacco Use  Smoking Status Former   Packs/day: 0.25   Types: Cigarettes   Quit date: 02/16/2015   Years since quitting: 5.9  Smokeless Tobacco Never  Tobacco Comments   currently smoking 3 - 4 cigs/day    Past Surgical History:  Procedure Laterality Date   APPENDECTOMY  2011    Immunization History  Administered Date(s) Administered  Tdap 09/11/2017    Recent Results (from the past 2160 hour(s))  CBC with Differential/Platelet     Status: Abnormal   Collection Time: 12/06/20 12:00 AM  Result Value Ref Range   WBC 7.7 3.8 - 10.8 Thousand/uL   RBC 4.94 4.20 - 5.80 Million/uL   Hemoglobin 16.2 13.2 - 17.1 g/dL   HCT 48.1 38.5 - 50.0 %   MCV 97.4 80.0 - 100.0 fL   MCH 32.8 27.0 - 33.0 pg   MCHC 33.7 32.0 - 36.0 g/dL   RDW 12.9 11.0 - 15.0 %   Platelets 250 140 - 400 Thousand/uL   MPV 12.7 (H) 7.5 - 12.5 fL   Neutro Abs 4,420 1,500 - 7,800 cells/uL   Lymphs Abs 2,210 850 - 3,900 cells/uL   Absolute Monocytes 685 200 - 950 cells/uL   Eosinophils Absolute 323 15 - 500 cells/uL   Basophils Absolute 62 0 - 200 cells/uL   Neutrophils Relative % 57.4 %   Total Lymphocyte 28.7 %   Monocytes Relative 8.9 %   Eosinophils Relative 4.2 %   Basophils Relative 0.8 %  COMPLETE METABOLIC PANEL WITH GFR     Status: Abnormal   Collection Time: 12/06/20 12:00 AM  Result Value Ref Range   Glucose, Bld 139 (H) 65 - 99 mg/dL    Comment: .            Fasting reference interval . For  someone without known diabetes, a glucose value >125 mg/dL indicates that they may have diabetes and this should be confirmed with a follow-up test. .    BUN 18 7 - 25 mg/dL   Creat 1.09 0.70 - 1.35 mg/dL   eGFR 77 > OR = 60 mL/min/1.91m2    Comment: The eGFR is based on the CKD-EPI 2021 equation. To calculate  the new eGFR from a previous Creatinine or Cystatin C result, go to https://www.kidney.org/professionals/ kdoqi/gfr%5Fcalculator    BUN/Creatinine Ratio NOT APPLICABLE 6 - 22 (calc)   Sodium 135 135 - 146 mmol/L   Potassium 4.1 3.5 - 5.3 mmol/L   Chloride 95 (L) 98 - 110 mmol/L   CO2 28 20 - 32 mmol/L   Calcium 9.9 8.6 - 10.3 mg/dL   Total Protein 7.2 6.1 - 8.1 g/dL   Albumin 4.7 3.6 - 5.1 g/dL   Globulin 2.5 1.9 - 3.7 g/dL (calc)   AG Ratio 1.9 1.0 - 2.5 (calc)   Total Bilirubin 0.9 0.2 - 1.2 mg/dL   Alkaline phosphatase (APISO) 49 35 - 144 U/L   AST 64 (H) 10 - 35 U/L   ALT 87 (H) 9 - 46 U/L  Lipid panel     Status: Abnormal   Collection Time: 12/06/20 12:00 AM  Result Value Ref Range   Cholesterol 304 (H) <200 mg/dL   HDL 37 (L) > OR = 40 mg/dL   Triglycerides 1,176 (H) <150 mg/dL    Comment: Verified by repeat analysis. Marland Kitchen . If a non-fasting specimen was collected, consider repeat triglyceride testing on a fasting specimen if clinically indicated.  Yates Decamp et al. J. of Clin. Lipidol. 6160;7:371-062. . . There is increased risk of pancreatitis when the  triglyceride concentration is very high  (> or = 500 mg/dL, especially if > or = 1000 mg/dL).  Yates Decamp et al. J. of Clin. Lipidol. 6948;5:462-703. Marland Kitchen    LDL Cholesterol (Calc)  mg/dL (calc)    Comment: . LDL cholesterol not calculated. Triglyceride levels greater than 400 mg/dL invalidate calculated LDL  results. . Reference range: <100 . Desirable range <100 mg/dL for primary prevention;   <70 mg/dL for patients with CHD or diabetic patients  with > or = 2 CHD risk factors. Marland Kitchen LDL-C is now  calculated using the Martin-Hopkins  calculation, which is a validated novel method providing  better accuracy than the Friedewald equation in the  estimation of LDL-C.  Cresenciano Genre et al. Annamaria Helling. 8828;003(49): 2061-2068  (http://education.[facility].com/faq/FAQ164)    Total CHOL/HDL Ratio 8.2 (H) <5.0 (calc)   Non-HDL Cholesterol (Calc) 267 (H) <130 mg/dL (calc)    Comment: Non-HDL level > or = 220 is very high and may indicate  genetic familial hypercholesterolemia (FH). Clinical  assessment and measurement of blood lipid levels  should be considered for all first-degree relatives  of patients with an FH diagnosis. . For patients with diabetes plus 1 major ASCVD risk  factor, treating to a non-HDL-C goal of <100 mg/dL  (LDL-C of <70 mg/dL) is considered a therapeutic  option.   TSH     Status: None   Collection Time: 12/06/20 12:00 AM  Result Value Ref Range   TSH 1.80 0.40 - 4.50 mIU/L  Magnesium     Status: None   Collection Time: 12/06/20 12:00 AM  Result Value Ref Range   Magnesium 1.5 1.5 - 2.5 mg/dL  VITAMIN D 25 Hydroxy (Vit-D Deficiency, Fractures)     Status: Abnormal   Collection Time: 12/06/20 12:00 AM  Result Value Ref Range   Vit D, 25-Hydroxy 11 (L) 30 - 100 ng/mL    Comment: Vitamin D Status         25-OH Vitamin D: . Deficiency:                    <20 ng/mL Insufficiency:             20 - 29 ng/mL Optimal:                 > or = 30 ng/mL . For 25-OH Vitamin D testing on patients on  D2-supplementation and patients for whom quantitation  of D2 and D3 fractions is required, the QuestAssureD(TM) 25-OH VIT D, (D2,D3), LC/MS/MS is recommended: order  code [id number] (patients >73yrs). See Note 1 . Note 1 . For additional information, please refer to  http://education.[facility].com/faq/FAQ199  (This link is being provided for informational/ educational purposes only.)   Hemoglobin A1c     Status: Abnormal   Collection Time: 12/06/20 12:00 AM   Result Value Ref Range   Hgb A1c MFr Bld 7.1 (H) <5.7 % of total Hgb    Comment: For someone without known diabetes, a hemoglobin A1c value of 6.5% or greater indicates that they may have  diabetes and this should be confirmed with a follow-up  test. . For someone with known diabetes, a value <7% indicates  that their diabetes is well controlled and a value  greater than or equal to 7% indicates suboptimal  control. A1c targets should be individualized based on  duration of diabetes, age, comorbid conditions, and  other considerations. . Currently, no consensus exists regarding use of hemoglobin A1c for diagnosis of diabetes for children. .    Mean Plasma Glucose 157 mg/dL   eAG (mmol/L) 8.7 mmol/L  Vitamin B12     Status: None   Collection Time: 12/06/20 12:00 AM  Result Value Ref Range   Vitamin B-12 308 200 - 1,100 pg/mL    Comment: . Please Note: Although the reference range  for vitamin B12 is [phone] pg/mL, it has been reported that between 5 and 10% of patients with values between 200 and 400 pg/mL may experience neuropsychiatric and hematologic abnormalities due to occult B12 deficiency; less than 1% of patients with values above 400 pg/mL will have symptoms. .   Lipid Panel w/reflex Direct LDL     Status: Abnormal   Collection Time: 01/11/21 10:09 AM  Result Value Ref Range   Cholesterol 190 <200 mg/dL   HDL 36 (L) > OR = 40 mg/dL   Triglycerides 737 (H) <150 mg/dL    Comment: . If a non-fasting specimen was collected, consider repeat triglyceride testing on a fasting specimen if clinically indicated.  Yates Decamp et al. J. of Clin. Lipidol. 0454;0:981-191. . . There is increased risk of pancreatitis when the  triglyceride concentration is very high  (> or = 500 mg/dL, especially if > or = 1000 mg/dL).  Yates Decamp et al. J. of Clin. Lipidol. 4782;9:562-130. Marland Kitchen    LDL Cholesterol (Calc)  mg/dL (calc)    Comment: . LDL cholesterol not calculated. Triglyceride  levels greater than 400 mg/dL invalidate calculated LDL results. . Reference range: <100 . Desirable range <100 mg/dL for primary prevention;   <70 mg/dL for patients with CHD or diabetic patients  with > or = 2 CHD risk factors. Marland Kitchen LDL-C is now calculated using the Martin-Hopkins  calculation, which is a validated novel method providing  better accuracy than the Friedewald equation in the  estimation of LDL-C.  Cresenciano Genre et al. Annamaria Helling. 8657;846(96): 2061-2068  (http://education.[facility].com/faq/FAQ164)    Total CHOL/HDL Ratio 5.3 (H) <5.0 (calc)   Non-HDL Cholesterol (Calc) 154 (H) <130 mg/dL (calc)    Comment: For patients with diabetes plus 1 major ASCVD risk  factor, treating to a non-HDL-C goal of <100 mg/dL  (LDL-C of <70 mg/dL) is considered a therapeutic  option.   COMPLETE METABOLIC PANEL WITH GFR     Status: Abnormal   Collection Time: 01/11/21 10:09 AM  Result Value Ref Range   Glucose, Bld 149 (H) 65 - 99 mg/dL    Comment: .            Fasting reference interval . For someone without known diabetes, a glucose value >125 mg/dL indicates that they may have diabetes and this should be confirmed with a follow-up test. .    BUN 21 7 - 25 mg/dL   Creat 1.17 0.70 - 1.35 mg/dL   eGFR 70 > OR = 60 mL/min/1.22m2    Comment: The eGFR is based on the CKD-EPI 2021 equation. To calculate  the new eGFR from a previous Creatinine or Cystatin C result, go to https://www.kidney.org/professionals/ kdoqi/gfr%5Fcalculator    BUN/Creatinine Ratio NOT APPLICABLE 6 - 22 (calc)   Sodium 138 135 - 146 mmol/L   Potassium 3.5 3.5 - 5.3 mmol/L   Chloride 99 98 - 110 mmol/L   CO2 28 20 - 32 mmol/L   Calcium 9.4 8.6 - 10.3 mg/dL   Total Protein 6.7 6.1 - 8.1 g/dL   Albumin 4.5 3.6 - 5.1 g/dL   Globulin 2.2 1.9 - 3.7 g/dL (calc)   AG Ratio 2.0 1.0 - 2.5 (calc)   Total Bilirubin 0.6 0.2 - 1.2 mg/dL   Alkaline phosphatase (APISO) 50 35 - 144 U/L   AST 40 (H) 10 - 35 U/L   ALT  71 (H) 9 - 46 U/L  DIRECT LDL     Status: None   Collection Time: 01/11/21 10:09 AM  Result Value Ref Range   Direct LDL 81 <100 mg/dL    Comment: . Desirable range <100 mg/dL for primary prevention;   <70 mg/dL for patients with CHD or diabetic patients  with > or = 2 CHD risk factors. .     No results found.     All questions at time of visit were answered - patient instructed to contact office with any additional concerns or updates. ER/RTC precautions were reviewed with the patient as applicable.   Please note: manual typing as well as voice recognition software may have been used to produce this document - typos may escape review. Please contact Dr. Sheppard Coil for any needed clarifications.

## 2021-02-13 ENCOUNTER — Other Ambulatory Visit: Payer: Self-pay | Admitting: Osteopathic Medicine

## 2021-04-13 ENCOUNTER — Ambulatory Visit: Payer: BC Managed Care – PPO | Admitting: Family Medicine

## 2021-05-10 ENCOUNTER — Telehealth: Payer: Self-pay | Admitting: *Deleted

## 2021-05-10 NOTE — Telephone Encounter (Signed)
Message left on pt voicemail about [employer] and my contact information. Encouraged pt to call back.

## 2021-05-17 ENCOUNTER — Other Ambulatory Visit: Payer: Self-pay

## 2021-05-17 ENCOUNTER — Ambulatory Visit: Payer: BC Managed Care – PPO | Admitting: Family Medicine

## 2021-05-17 ENCOUNTER — Encounter: Payer: Self-pay | Admitting: Family Medicine

## 2021-05-17 VITALS — BP 117/80 | HR 104 | Temp 98.0°F | Ht 67.0 in | Wt 224.0 lb

## 2021-05-17 DIAGNOSIS — E1169 Type 2 diabetes mellitus with other specified complication: Secondary | ICD-10-CM | POA: Diagnosis not present

## 2021-05-17 DIAGNOSIS — E785 Hyperlipidemia, unspecified: Secondary | ICD-10-CM

## 2021-05-17 DIAGNOSIS — R5383 Other fatigue: Secondary | ICD-10-CM

## 2021-05-17 DIAGNOSIS — I10 Essential (primary) hypertension: Secondary | ICD-10-CM

## 2021-05-17 DIAGNOSIS — E669 Obesity, unspecified: Secondary | ICD-10-CM

## 2021-05-17 DIAGNOSIS — R635 Abnormal weight gain: Secondary | ICD-10-CM | POA: Diagnosis not present

## 2021-05-17 HISTORY — DX: Type 2 diabetes mellitus with other specified complication: E78.5

## 2021-05-17 HISTORY — DX: Type 2 diabetes mellitus with other specified complication: E11.69

## 2021-05-17 HISTORY — DX: Obesity, unspecified: E66.9

## 2021-05-17 HISTORY — DX: Abnormal weight gain: R63.5

## 2021-05-17 NOTE — Assessment & Plan Note (Signed)
He is having some mild myalgias that he thinks may be related to atorvastatin.  Updating lipid panel today.

## 2021-05-17 NOTE — Patient Instructions (Signed)
Nice to meet you today! Continue current medications for now. We'll be in touch with lab results and recommendations.

## 2021-05-17 NOTE — Assessment & Plan Note (Signed)
He is concerned about low testosterone level today.  Labs noted.

## 2021-05-17 NOTE — Progress Notes (Signed)
Brandon Conrad - 66 y.o. male MRN [id number]  Date of birth: 1956/04/19  Subjective Chief Complaint  Patient presents with   Transitions Of Care    HPI Brandon Conrad is a 66 year old male here today for follow-up visit.  He is transferring care from Dr. Lyn Hollingshead.  He has history of type 2 diabetes.  Previous A1c of 7.1%.  He is currently on metformin for management of this.  He does not monitor blood sugars at home.  He has been working on some dietary changes.  He is tolerating atorvastatin well for associated hyperlipidemia however his goal is to get off of this medication.  He is concerned about weight gain as well as increased fatigue.  Thinks testosterone levels may be low.  He is also taking Lovaza and fenofibrate for management of associated hypertriglyceridemia.  He is tolerating this well  Blood pressure has remained well controlled with combination of valsartan and hydrochlorothiazide.  He denies chest pain, shortness of breath, palpitations, headaches or vision changes.  ROS:  A comprehensive ROS was completed and negative except as noted per HPI  Allergies  Allergen Reactions   Bee Venom Anaphylaxis   Cephalosporins Rash    Past Medical History:  Diagnosis Date   Anxiety    Hypertension     Past Surgical History:  Procedure Laterality Date   APPENDECTOMY  2011    Social History   Socioeconomic History   Marital status: Divorced    Spouse name: Not on file   Number of children: Not on file   Years of education: Not on file   Highest education level: Not on file  Occupational History   Not on file  Tobacco Use   Smoking status: Former    Packs/day: 0.25    Types: Cigarettes    Quit date: 02/16/2015    Years since quitting: 6.2   Smokeless tobacco: Never   Tobacco comments:    currently smoking 3 - 4 cigs/day  Substance and Sexual Activity   Alcohol use: Yes    Alcohol/week: 12.0 standard drinks    Types: 12 Standard drinks or equivalent per week    Comment:  1-2 beers q day   Drug use: No   Sexual activity: Yes    Partners: Female  Other Topics Concern   Not on file  Social History Narrative   Not on file   Social Determinants of Health   Financial Resource Strain: Not on file  Food Insecurity: Not on file  Transportation Needs: Not on file  Physical Activity: Not on file  Stress: Not on file  Social Connections: Not on file    History reviewed. No pertinent family history.  Health Maintenance  Topic Date Due   Zoster Vaccines- Shingrix (1 of 2) Never done   INFLUENZA VACCINE  Never done   Pneumococcal Vaccine 55-90 Years old (1 - PCV) 12/06/2021 (Originally 03/03/1962)   COLONOSCOPY (Pts 45-62yrs Insurance coverage will need to be confirmed)  12/06/2021 (Originally 03/04/2003)   TETANUS/TDAP  09/12/2027   Hepatitis C Screening  Completed   HIV Screening  Completed   HPV VACCINES  Aged Out   COVID-19 Vaccine  Discontinued     ----------------------------------------------------------------------------------------------------------------------------------------------------------------------------------------------------------------- Physical Exam BP 117/80 (BP Location: Left Arm, Patient Position: Sitting, Cuff Size: Large)    Pulse (!) 104    Temp 98 F (36.7 C) (Oral)    Ht 5\' 7"  (1.702 m)    Wt 224 lb (101.6 kg)    SpO2 95%  BMI 35.08 kg/m   Physical Exam Constitutional:      Appearance: Normal appearance.  Eyes:     General: No scleral icterus. Cardiovascular:     Rate and Rhythm: Normal rate and regular rhythm.  Pulmonary:     Effort: Pulmonary effort is normal.     Breath sounds: Normal breath sounds.  Musculoskeletal:     Cervical back: Neck supple.  Neurological:     Mental Status: He is alert.  Psychiatric:        Mood and Affect: Mood normal.        Behavior: Behavior normal.     ------------------------------------------------------------------------------------------------------------------------------------------------------------------------------------------------------------------- Assessment and Plan  Essential hypertension blood pressure remains well controlled.  Recommend continuation of current medications for management of hypertension.  Low-sodium diet encouraged.  Type 2 diabetes mellitus with obesity (HCC) Current management with metformin.  Encouraged low carbohydrate diet.  Updating A1c today.  Hyperlipidemia associated with type 2 diabetes mellitus (HCC) He is having some mild myalgias that he thinks may be related to atorvastatin.  Updating lipid panel today.  Abnormal weight gain He is concerned about low testosterone level today.  Labs noted.   No orders of the defined types were placed in this encounter.   Return in about 3 months (around 08/15/2021) for HLD/T2DM.    This visit occurred during the SARS-CoV-2 public health emergency.  Safety protocols were in place, including screening questions prior to the visit, additional usage of staff PPE, and extensive cleaning of exam room while observing appropriate contact time as indicated for disinfecting solutions.

## 2021-05-17 NOTE — Assessment & Plan Note (Signed)
blood pressure remains well controlled.  Recommend continuation of current medications for management of hypertension.  Low-sodium diet encouraged.

## 2021-05-17 NOTE — Assessment & Plan Note (Signed)
Current management with metformin.  Encouraged low carbohydrate diet.  Updating A1c today.

## 2021-05-18 LAB — COMPLETE METABOLIC PANEL WITH GFR
AG Ratio: 1.8 (calc) (ref 1.0–2.5)
ALT: 49 U/L — ABNORMAL HIGH (ref 9–46)
AST: 34 U/L (ref 10–35)
Albumin: 4.8 g/dL (ref 3.6–5.1)
Alkaline phosphatase (APISO): 39 U/L (ref 35–144)
BUN/Creatinine Ratio: 19 (calc) (ref 6–22)
BUN: 26 mg/dL — ABNORMAL HIGH (ref 7–25)
CO2: 25 mmol/L (ref 20–32)
Calcium: 9.9 mg/dL (ref 8.6–10.3)
Chloride: 93 mmol/L — ABNORMAL LOW (ref 98–110)
Creat: 1.38 mg/dL — ABNORMAL HIGH (ref 0.70–1.35)
Globulin: 2.6 g/dL (calc) (ref 1.9–3.7)
Glucose, Bld: 257 mg/dL — ABNORMAL HIGH (ref 65–99)
Potassium: 3.7 mmol/L (ref 3.5–5.3)
Sodium: 134 mmol/L — ABNORMAL LOW (ref 135–146)
Total Bilirubin: 0.8 mg/dL (ref 0.2–1.2)
Total Protein: 7.4 g/dL (ref 6.1–8.1)
eGFR: 57 mL/min/{1.73_m2} — ABNORMAL LOW (ref >=60)

## 2021-05-18 LAB — LIPID PANEL W/REFLEX DIRECT LDL
Cholesterol: 176 mg/dL (ref <200)
HDL: 31 mg/dL — ABNORMAL LOW (ref >=40)
Non-HDL Cholesterol (Calc): 145 mg/dL (calc) — ABNORMAL HIGH (ref <130)
Total CHOL/HDL Ratio: 5.7 (calc) — ABNORMAL HIGH (ref <5.0)
Triglycerides: 835 mg/dL — ABNORMAL HIGH (ref <150)

## 2021-05-18 LAB — TESTOSTERONE: Testosterone: 187 ng/dL — ABNORMAL LOW (ref 250–827)

## 2021-05-18 LAB — HEMOGLOBIN A1C
Hgb A1c MFr Bld: 9.5 % of total Hgb — ABNORMAL HIGH (ref <5.7)
Mean Plasma Glucose: 226 mg/dL
eAG (mmol/L): 12.5 mmol/L

## 2021-05-18 LAB — DIRECT LDL: Direct LDL: 60 mg/dL (ref <100)

## 2021-06-01 ENCOUNTER — Encounter: Payer: Self-pay | Admitting: Family Medicine

## 2021-06-01 ENCOUNTER — Telehealth (INDEPENDENT_AMBULATORY_CARE_PROVIDER_SITE_OTHER): Payer: Self-pay | Admitting: Family Medicine

## 2021-06-01 VITALS — Ht 67.0 in | Wt 224.0 lb

## 2021-06-01 DIAGNOSIS — Z91199 Patient's noncompliance with other medical treatment and regimen due to unspecified reason: Secondary | ICD-10-CM

## 2021-06-04 NOTE — Progress Notes (Signed)
Attempted to connect with patient multiple times including via phone as well as send a link via text message without response.  Patient did call back after his appointment had passed and I offered to work him in later in the day however he declined.

## 2021-06-21 ENCOUNTER — Other Ambulatory Visit: Payer: Self-pay | Admitting: Osteopathic Medicine

## 2021-07-05 ENCOUNTER — Other Ambulatory Visit: Payer: Self-pay | Admitting: Physician Assistant

## 2021-07-09 ENCOUNTER — Other Ambulatory Visit: Payer: Self-pay | Admitting: Osteopathic Medicine

## 2021-07-31 ENCOUNTER — Other Ambulatory Visit: Payer: Self-pay

## 2021-07-31 ENCOUNTER — Emergency Department
Admission: EM | Admit: 2021-07-31 | Discharge: 2021-07-31 | Disposition: A | Discharge status: Other Acute Inpatient Hospital | Payer: BC Managed Care – PPO | Source: Home / Self Care

## 2021-07-31 ENCOUNTER — Encounter: Payer: Self-pay | Admitting: Emergency Medicine

## 2021-07-31 DIAGNOSIS — R0789 Other chest pain: Secondary | ICD-10-CM | POA: Diagnosis not present

## 2021-07-31 DIAGNOSIS — E1165 Type 2 diabetes mellitus with hyperglycemia: Secondary | ICD-10-CM | POA: Diagnosis not present

## 2021-07-31 DIAGNOSIS — R519 Headache, unspecified: Secondary | ICD-10-CM | POA: Diagnosis not present

## 2021-07-31 DIAGNOSIS — Z87892 Personal history of anaphylaxis: Secondary | ICD-10-CM | POA: Diagnosis not present

## 2021-07-31 DIAGNOSIS — R42 Dizziness and giddiness: Secondary | ICD-10-CM

## 2021-07-31 DIAGNOSIS — R739 Hyperglycemia, unspecified: Secondary | ICD-10-CM | POA: Diagnosis not present

## 2021-07-31 DIAGNOSIS — Z7984 Long term (current) use of oral hypoglycemic drugs: Secondary | ICD-10-CM | POA: Diagnosis not present

## 2021-07-31 DIAGNOSIS — R110 Nausea: Secondary | ICD-10-CM | POA: Diagnosis not present

## 2021-07-31 DIAGNOSIS — Z91030 Bee allergy status: Secondary | ICD-10-CM | POA: Diagnosis not present

## 2021-07-31 DIAGNOSIS — Z881 Allergy status to other antibiotic agents status: Secondary | ICD-10-CM | POA: Diagnosis not present

## 2021-07-31 DIAGNOSIS — R079 Chest pain, unspecified: Secondary | ICD-10-CM | POA: Diagnosis not present

## 2021-07-31 DIAGNOSIS — R918 Other nonspecific abnormal finding of lung field: Secondary | ICD-10-CM | POA: Diagnosis not present

## 2021-07-31 LAB — POCT FASTING CBG KUC MANUAL ENTRY: POCT Glucose (KUC): 451 mg/dL — AB (ref 70–99)

## 2021-07-31 NOTE — ED Notes (Signed)
.  Patient is being discharged from the Urgent Care and sent to the Emergency Department via friends vehicle . Per Trevor Iha NP, patient is in need of higher level of care due to hyperglycemia with CBG 451 here at urgent care . Patient is aware and verbalizes understanding of plan of care.  ?Vitals:  ? 07/31/21 1817 07/31/21 1823  ?BP: 137/87   ?Pulse: (!) 122   ?Resp: 16   ?Temp:  98 ?F (36.7 ?C)  ?SpO2: 99%   ?  ?

## 2021-07-31 NOTE — Discharge Instructions (Addendum)
Advised/instructed patient to go to [facility] NOW for immediate evaluation (to include IV fluids, insulin, and probable serological testing).  Patient does not want to pay for ambulance and has called a friend to come and pick him up within the next 10 minutes rather than notifying EMS. ?

## 2021-07-31 NOTE — ED Provider Notes (Signed)
?[facility] ? ? ? ?CSN: [id number] ?Arrival date & time: 07/31/21  1759 ? ? ?  ? ?History   ?Chief Complaint ?Chief Complaint  ?Patient presents with  ? Dizziness  ? ? ?HPI ?Brandon Conrad is a 66 y.o. male.  ? ?HPI 66 year old male presents with lightheadedness, visual acuity changes for the past 3 days.  Reports feels shaky and thirsty often.  CBG done at triage reveals 451 blood glucose.  PMH significant for T2DM with obesity, palpitations, and HTN.  ? ?Past Medical History:  ?Diagnosis Date  ? Anxiety   ? Hypertension   ? ? ?Patient Active Problem List  ? Diagnosis Date Noted  ? Type 2 diabetes mellitus with obesity (HCC) 05/17/2021  ? Hyperlipidemia associated with type 2 diabetes mellitus (HCC) 05/17/2021  ? Abnormal weight gain 05/17/2021  ? H/O cardiovascular stress test 09/03/2019  ? 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 Anxiety state 03/28/2015  ? Palpitations 02/18/2015  ? Essential hypertension 02/18/2015  ? Circadian rhythm disorder 02/18/2015  ? History of episode of anxiety 02/18/2015  ? Encounter for tobacco use cessation counseling 02/18/2015  ? ? ?Past Surgical History:  ?Procedure Laterality Date  ? APPENDECTOMY  2011  ? ? ? ? ? ?Home Medications   ? ?Prior to Admission medications   ?Medication Sig Start Date End Date Taking? Authorizing Provider  ?cyclobenzaprine (FLEXERIL) 10 MG tablet TAKE 1/2 TO 1 TABLET BY MOUTH 3 TIMES A DAY AS NEEDED FOR MUSCLE SPASMS, MAY CAUSE DROWSINESS 07/05/21   Everrett CoombeMatthews, Cody, DO  ?atorvastatin (LIPITOR) 20 MG tablet Take 0.5 tablets (10 mg total) by mouth daily. 01/12/21   Sunnie NielsenAlexander, Natalie, DO  ?fenofibrate 160 MG tablet TAKE 1 TABLET BY MOUTH EVERY DAY 07/11/21   Everrett CoombeMatthews, Cody, DO  ?hydrochlorothiazide (HYDRODIURIL) 25 MG tablet TAKE 1 TABLET BY MOUTH EVERY DAY 06/21/21   Everrett CoombeMatthews, Cody, DO  ?metFORMIN (GLUCOPHAGE XR) 500 MG 24 hr tablet Take 1 tablet (500 mg total) by mouth daily with breakfast. 12/07/20 12/07/21  Christen ButterJessup, Joy, NP  ?omega-3 acid ethyl esters  (LOVAZA) 1 g capsule Take 2 capsules (2 g total) by mouth 2 (two) times daily. 12/07/20   Christen ButterJessup, Joy, NP  ?omeprazole (PRILOSEC) 40 MG capsule TAKE 1 CAPSULE BY MOUTH EVERY DAY 01/12/21   Sunnie NielsenAlexander, Natalie, DO  ?valsartan (DIOVAN) 320 MG tablet TAKE 1 TABLET BY MOUTH EVERY DAY 12/27/20   Sunnie NielsenAlexander, Natalie, DO  ? ? ?Family History ?History reviewed. No pertinent family history. ? ?Social History ?Social History  ? ?Tobacco Use  ? Smoking status: Former  ?  Packs/day: 0.25  ?  Types: Cigarettes  ?  Quit date: 02/16/2015  ?  Years since quitting: 6.4  ? Smokeless tobacco: Never  ? Tobacco comments:  ?  currently smoking 3 - 4 cigs/day  ?Substance Use Topics  ? Alcohol use: Yes  ?  Alcohol/week: 12.0 standard drinks  ?  Types: 12 Standard drinks or equivalent per week  ?  Comment: 1-2 beers q day  ? Drug use: No  ? ? ? ?Allergies   ?Bee venom and Cephalosporins ? ? ?Review of Systems ?Review of Systems  ?Eyes:  Positive for visual disturbance.  ?Neurological:  Positive for light-headedness.  ?All other systems reviewed and are negative. ? ? ?Physical Exam ?Triage Vital Signs ?ED Triage Vitals  ?Enc Vitals Group  ?   BP 07/31/21 1817 137/87  ?   Pulse Rate 07/31/21 1817 (!) 122  ?   Resp 07/31/21 1817 16  ?  Temp 07/31/21 1823 98 ?F (36.7 ?C)  ?   Temp Source 07/31/21 1817 Oral  ?   SpO2 07/31/21 1817 99 %  ?   Weight --   ?   Height --   ?   Head Circumference --   ?   Peak Flow --   ?   Pain Score 07/31/21 1820 0  ?   Pain Loc --   ?   Pain Edu? --   ?   Excl. in GC? --   ? ?No data found. ? ?Updated Vital Signs ?BP 137/87 (BP Location: Right Arm)   Pulse (!) 122   Temp 98 ?F (36.7 ?C) (Oral)   Resp 16   SpO2 99%  ? ? ? ?Physical Exam ?Vitals and nursing note reviewed.  ?Constitutional:   ?   General: He is not in acute distress. ?   Appearance: Normal appearance. He is obese. He is not ill-appearing.  ?HENT:  ?   Head: Normocephalic and atraumatic.  ?   Right Ear: Tympanic membrane, ear canal and external ear  normal.  ?   Left Ear: Tympanic membrane, ear canal and external ear normal.  ?   Mouth/Throat:  ?   Mouth: Mucous membranes are moist.  ?   Pharynx: Oropharynx is clear.  ?Eyes:  ?   Extraocular Movements: Extraocular movements intact.  ?   Conjunctiva/sclera: Conjunctivae normal.  ?   Pupils: Pupils are equal, round, and reactive to light.  ?Cardiovascular:  ?   Rate and Rhythm: Regular rhythm. Tachycardia present.  ?   Pulses: Normal pulses.  ?   Heart sounds: Normal heart sounds.  ?Pulmonary:  ?   Effort: Pulmonary effort is normal.  ?   Breath sounds: Normal breath sounds. No wheezing, rhonchi or rales.  ?Musculoskeletal:  ?   Cervical back: Normal range of motion and neck supple.  ?Skin: ?   General: Skin is warm and dry.  ?Neurological:  ?   General: No focal deficit present.  ?   Mental Status: He is alert and oriented to person, place, and time. Mental status is at baseline.  ?   Cranial Nerves: No cranial nerve deficit.  ?   Motor: No weakness.  ?   Coordination: Coordination normal.  ?   Gait: Gait normal.  ? ? ? ?UC Treatments / Results  ?Labs ?(all labs ordered are listed, but only abnormal results are displayed) ?Labs Reviewed  ?POCT FASTING CBG [facility] MANUAL ENTRY - Abnormal; Notable for the following components:  ?    Result Value  ? POCT Glucose ([facility]) 451 (*)   ? All other components within normal limits  ? ? ?EKG ? ? ?Radiology ?No results found. ? ?Procedures ?Procedures (including critical care time) ? ?Medications Ordered in UC ?Medications - No data to display ? ?Initial Impression / Assessment and Plan / UC Course  ?I have reviewed the triage vital signs and the nursing notes. ? ?Pertinent labs & imaging results that were available during my care of the patient were reviewed by me and considered in my medical decision making (see chart for details). ? ?  ? ?MDM: 1.  Hyperglycemia-Advised/instructed patient to go to [facility] NOW for immediate evaluation (to include IV fluids, insulin, and probable  serological testing).  Patient does not want to pay for ambulance and has called a friend to come and pick him up within the next 10 minutes rather than notifying EMS.  2.  Lightheadedness-as above have  advised patient to go to [facility] NOW for immediate evaluation.  Patient agreed and verbalized understanding of these instructions and this plan of care this evening.  Patient's friend will drive him to local ED now.  Staff RN has called ahead to this ED to make charge nurse aware of patient's arrival.  Patient discharged, hemodynamically stable. ?Final Clinical Impressions(s) / UC Diagnoses  ? ?Final diagnoses:  ?Lightheadedness  ?Hyperglycemia  ? ? ? ?Discharge Instructions   ? ?  ?Advised/instructed patient to go to [facility] NOW for immediate evaluation (to include IV fluids, insulin, and probable serological testing).  Patient does not want to pay for ambulance and has called a friend to come and pick him up within the next 10 minutes rather than notifying EMS. ? ? ? ? ?ED Prescriptions   ?None ?  ? ?PDMP not reviewed this encounter. ?  ?Trevor Iha, FNP ?07/31/21 1854 ? ?

## 2021-07-31 NOTE — ED Triage Notes (Signed)
Pt c/o feeling lightheaded and some vision changes over the last few days. Feels shaky and thirsty often. CBG done 451 ?

## 2021-08-02 ENCOUNTER — Telehealth: Payer: Self-pay | Admitting: General Practice

## 2021-08-02 NOTE — Telephone Encounter (Signed)
Transition Care Management Follow-up Telephone Call ?Date of discharge and from where: 08/01/21 from [facility] ?How have you been since you were released from the hospital? Doing better today. He plans to get a glucometer today from work. ?Any questions or concerns? No ? ?Items Reviewed: ?Did the pt receive and understand the discharge instructions provided? Yes  ?Medications obtained and verified? Yes  ?Other? No  ?Any new allergies since your discharge? No  ?Dietary orders reviewed? Yes ?Do you have support at home? Yes  ? ?Home Care and Equipment/Supplies: ?Were home health services ordered? no ? ?Functional Questionnaire: (I = Independent and D = Dependent) ?ADLs: I ? ?Bathing/Dressing- I ? ?Meal Prep- I ? ?Eating- I ? ?Maintaining continence- I ? ?Transferring/Ambulation- I ? ?Managing Meds- I ? ?Follow up appointments reviewed: ? ?PCP Hospital f/u appt confirmed? Yes  Scheduled to see Dr. Ashley Royalty on 08/09/21. ?Specialist Hospital f/u appt confirmed? No   ?Are transportation arrangements needed? No  ?If their condition worsens, is the pt aware to call PCP or go to the Emergency Dept.? Yes ?Was the patient provided with contact information for the PCP's office or ED? Yes ?Was to pt encouraged to call back with questions or concerns? Yes  ?

## 2021-08-03 ENCOUNTER — Inpatient Hospital Stay (HOSPITAL_COMMUNITY)
Admission: EM | Admit: 2021-08-03 | Discharge: 2021-08-06 | DRG: 683 | Disposition: A | Discharge status: Home / Self Care | Payer: BC Managed Care – PPO | Attending: Internal Medicine | Admitting: Internal Medicine

## 2021-08-03 ENCOUNTER — Other Ambulatory Visit: Payer: Self-pay

## 2021-08-03 ENCOUNTER — Encounter (HOSPITAL_COMMUNITY): Payer: Self-pay

## 2021-08-03 ENCOUNTER — Emergency Department (HOSPITAL_COMMUNITY): Payer: BC Managed Care – PPO

## 2021-08-03 DIAGNOSIS — Z79899 Other long term (current) drug therapy: Secondary | ICD-10-CM | POA: Diagnosis not present

## 2021-08-03 DIAGNOSIS — R7989 Other specified abnormal findings of blood chemistry: Secondary | ICD-10-CM | POA: Diagnosis not present

## 2021-08-03 DIAGNOSIS — E785 Hyperlipidemia, unspecified: Secondary | ICD-10-CM | POA: Diagnosis not present

## 2021-08-03 DIAGNOSIS — E1165 Type 2 diabetes mellitus with hyperglycemia: Secondary | ICD-10-CM | POA: Diagnosis not present

## 2021-08-03 DIAGNOSIS — E1169 Type 2 diabetes mellitus with other specified complication: Secondary | ICD-10-CM | POA: Diagnosis present

## 2021-08-03 DIAGNOSIS — I700 Atherosclerosis of aorta: Secondary | ICD-10-CM | POA: Diagnosis not present

## 2021-08-03 DIAGNOSIS — Z7984 Long term (current) use of oral hypoglycemic drugs: Secondary | ICD-10-CM

## 2021-08-03 DIAGNOSIS — F419 Anxiety disorder, unspecified: Secondary | ICD-10-CM | POA: Diagnosis not present

## 2021-08-03 DIAGNOSIS — E781 Pure hyperglyceridemia: Secondary | ICD-10-CM | POA: Diagnosis not present

## 2021-08-03 DIAGNOSIS — I119 Hypertensive heart disease without heart failure: Secondary | ICD-10-CM | POA: Diagnosis present

## 2021-08-03 DIAGNOSIS — R42 Dizziness and giddiness: Secondary | ICD-10-CM | POA: Diagnosis not present

## 2021-08-03 DIAGNOSIS — R0789 Other chest pain: Secondary | ICD-10-CM | POA: Diagnosis present

## 2021-08-03 DIAGNOSIS — N179 Acute kidney failure, unspecified: Principal | ICD-10-CM | POA: Diagnosis present

## 2021-08-03 DIAGNOSIS — Z91030 Bee allergy status: Secondary | ICD-10-CM

## 2021-08-03 DIAGNOSIS — I2510 Atherosclerotic heart disease of native coronary artery without angina pectoris: Secondary | ICD-10-CM | POA: Diagnosis not present

## 2021-08-03 DIAGNOSIS — E669 Obesity, unspecified: Secondary | ICD-10-CM | POA: Diagnosis not present

## 2021-08-03 DIAGNOSIS — I10 Essential (primary) hypertension: Secondary | ICD-10-CM | POA: Diagnosis present

## 2021-08-03 DIAGNOSIS — Z6832 Body mass index (BMI) 32.0-32.9, adult: Secondary | ICD-10-CM

## 2021-08-03 DIAGNOSIS — E860 Dehydration: Secondary | ICD-10-CM | POA: Diagnosis present

## 2021-08-03 DIAGNOSIS — I959 Hypotension, unspecified: Secondary | ICD-10-CM | POA: Diagnosis not present

## 2021-08-03 DIAGNOSIS — E871 Hypo-osmolality and hyponatremia: Secondary | ICD-10-CM | POA: Diagnosis not present

## 2021-08-03 DIAGNOSIS — K760 Fatty (change of) liver, not elsewhere classified: Secondary | ICD-10-CM | POA: Diagnosis not present

## 2021-08-03 DIAGNOSIS — F17210 Nicotine dependence, cigarettes, uncomplicated: Secondary | ICD-10-CM | POA: Diagnosis not present

## 2021-08-03 DIAGNOSIS — R072 Precordial pain: Secondary | ICD-10-CM | POA: Diagnosis not present

## 2021-08-03 DIAGNOSIS — R079 Chest pain, unspecified: Secondary | ICD-10-CM | POA: Diagnosis present

## 2021-08-03 DIAGNOSIS — I719 Aortic aneurysm of unspecified site, without rupture: Secondary | ICD-10-CM | POA: Diagnosis not present

## 2021-08-03 DIAGNOSIS — Z881 Allergy status to other antibiotic agents status: Secondary | ICD-10-CM

## 2021-08-03 DIAGNOSIS — R739 Hyperglycemia, unspecified: Secondary | ICD-10-CM | POA: Diagnosis not present

## 2021-08-03 DIAGNOSIS — I952 Hypotension due to drugs: Secondary | ICD-10-CM | POA: Diagnosis not present

## 2021-08-03 LAB — CBC WITH DIFFERENTIAL/PLATELET
Abs Immature Granulocytes: 0.08 10*3/uL — ABNORMAL HIGH (ref 0.00–0.07)
Basophils Absolute: 0 10*3/uL (ref 0.0–0.1)
Basophils Relative: 0 %
Eosinophils Absolute: 0 10*3/uL (ref 0.0–0.5)
Eosinophils Relative: 0 %
HCT: 35.1 % — ABNORMAL LOW (ref 39.0–52.0)
Hemoglobin: 11.9 g/dL — ABNORMAL LOW (ref 13.0–17.0)
Immature Granulocytes: 1 %
Lymphocytes Relative: 16 %
Lymphs Abs: 1.1 10*3/uL (ref 0.7–4.0)
MCH: 31.6 pg (ref 26.0–34.0)
MCHC: 33.9 g/dL (ref 30.0–36.0)
MCV: 93.4 fL (ref 80.0–100.0)
Monocytes Absolute: 1.3 10*3/uL — ABNORMAL HIGH (ref 0.1–1.0)
Monocytes Relative: 18 %
Neutro Abs: 4.5 10*3/uL (ref 1.7–7.7)
Neutrophils Relative %: 65 %
Platelets: 166 10*3/uL (ref 150–400)
RBC: 3.76 MIL/uL — ABNORMAL LOW (ref 4.22–5.81)
RDW: 12.5 % (ref 11.5–15.5)
WBC: 7 10*3/uL (ref 4.0–10.5)
nRBC: 0 % (ref 0.0–0.2)

## 2021-08-03 LAB — I-STAT CHEM 8, ED
BUN: 26 mg/dL — ABNORMAL HIGH (ref 8–23)
Calcium, Ion: 1.12 mmol/L — ABNORMAL LOW (ref 1.15–1.40)
Chloride: 91 mmol/L — ABNORMAL LOW (ref 98–111)
Creatinine, Ser: 2.1 mg/dL — ABNORMAL HIGH (ref 0.61–1.24)
Glucose, Bld: 300 mg/dL — ABNORMAL HIGH (ref 70–99)
HCT: 37 % — ABNORMAL LOW (ref 39.0–52.0)
Hemoglobin: 12.6 g/dL — ABNORMAL LOW (ref 13.0–17.0)
Potassium: 3.9 mmol/L (ref 3.5–5.1)
Sodium: 129 mmol/L — ABNORMAL LOW (ref 135–145)
TCO2: 25 mmol/L (ref 22–32)

## 2021-08-03 LAB — COMPREHENSIVE METABOLIC PANEL
ALT: 46 U/L — ABNORMAL HIGH (ref 0–44)
AST: 45 U/L — ABNORMAL HIGH (ref 15–41)
Albumin: 3.6 g/dL (ref 3.5–5.0)
Alkaline Phosphatase: 30 U/L — ABNORMAL LOW (ref 38–126)
Anion gap: 15 (ref 5–15)
BUN: 26 mg/dL — ABNORMAL HIGH (ref 8–23)
CO2: 23 mmol/L (ref 22–32)
Calcium: 9 mg/dL (ref 8.9–10.3)
Chloride: 91 mmol/L — ABNORMAL LOW (ref 98–111)
Creatinine, Ser: 2.49 mg/dL — ABNORMAL HIGH (ref 0.61–1.24)
GFR, Estimated: 28 mL/min — ABNORMAL LOW (ref >60)
Glucose, Bld: 303 mg/dL — ABNORMAL HIGH (ref 70–99)
Potassium: 3.9 mmol/L (ref 3.5–5.1)
Sodium: 129 mmol/L — ABNORMAL LOW (ref 135–145)
Total Bilirubin: 1.2 mg/dL (ref 0.3–1.2)
Total Protein: 6.3 g/dL — ABNORMAL LOW (ref 6.5–8.1)

## 2021-08-03 LAB — CREATININE, SERUM
Creatinine, Ser: 2.32 mg/dL — ABNORMAL HIGH (ref 0.61–1.24)
GFR, Estimated: 31 mL/min — ABNORMAL LOW (ref >60)

## 2021-08-03 LAB — BRAIN NATRIURETIC PEPTIDE: B Natriuretic Peptide: 4.7 pg/mL (ref 0.0–100.0)

## 2021-08-03 LAB — CBC
HCT: 35.5 % — ABNORMAL LOW (ref 39.0–52.0)
Hemoglobin: 12.2 g/dL — ABNORMAL LOW (ref 13.0–17.0)
MCH: 31.8 pg (ref 26.0–34.0)
MCHC: 34.4 g/dL (ref 30.0–36.0)
MCV: 92.4 fL (ref 80.0–100.0)
Platelets: 169 10*3/uL (ref 150–400)
RBC: 3.84 MIL/uL — ABNORMAL LOW (ref 4.22–5.81)
RDW: 12.4 % (ref 11.5–15.5)
WBC: 5.3 10*3/uL (ref 4.0–10.5)
nRBC: 0 % (ref 0.0–0.2)

## 2021-08-03 LAB — TROPONIN I (HIGH SENSITIVITY)
Troponin I (High Sensitivity): 7 ng/L (ref <18)
Troponin I (High Sensitivity): 5 ng/L (ref <18)

## 2021-08-03 LAB — MAGNESIUM: Magnesium: 1.3 mg/dL — ABNORMAL LOW (ref 1.7–2.4)

## 2021-08-03 LAB — HEMOGLOBIN A1C
Hgb A1c MFr Bld: 14.2 % — ABNORMAL HIGH (ref 4.8–5.6)
Mean Plasma Glucose: 360.84 mg/dL

## 2021-08-03 LAB — CBG MONITORING, ED: Glucose-Capillary: 298 mg/dL — ABNORMAL HIGH (ref 70–99)

## 2021-08-03 MED ORDER — ASPIRIN EC 325 MG PO TBEC: 325.0000 mg | DELAYED_RELEASE_TABLET | Freq: Every day | ORAL | Status: DC | PRN

## 2021-08-03 MED ORDER — LIDOCAINE VISCOUS HCL 2 % MT SOLN
15.0000 mL | Freq: Once | OROMUCOSAL | Status: AC
  Administered 2021-08-03: 15 mL via ORAL
  Filled 2021-08-03: qty 15

## 2021-08-03 MED ORDER — MORPHINE SULFATE (PF) 2 MG/ML IV SOLN: 2.0000 mg | INTRAVENOUS | Status: DC | PRN

## 2021-08-03 MED ORDER — ONDANSETRON HCL 4 MG/2ML IJ SOLN: 4.0000 mg | Freq: Four times a day (QID) | INTRAMUSCULAR | Status: DC | PRN

## 2021-08-03 MED ORDER — FENOFIBRATE 160 MG PO TABS
160.0000 mg | ORAL_TABLET | Freq: Every day | ORAL | Status: DC
  Filled 2021-08-03: qty 1

## 2021-08-03 MED ORDER — ACETAMINOPHEN 325 MG PO TABS
650.0000 mg | ORAL_TABLET | ORAL | Status: DC | PRN
  Filled 2021-08-03: qty 2

## 2021-08-03 MED ORDER — ENOXAPARIN SODIUM 40 MG/0.4ML IJ SOSY
40.0000 mg | PREFILLED_SYRINGE | INTRAMUSCULAR | Status: DC
  Administered 2021-08-04 – 2021-08-05 (×2): 40 mg via SUBCUTANEOUS
  Filled 2021-08-03 (×2): qty 0.4

## 2021-08-03 MED ORDER — ATORVASTATIN CALCIUM 10 MG PO TABS
20.0000 mg | ORAL_TABLET | Freq: Every day | ORAL | Status: DC
Start: 2021-08-04 — End: 2021-08-06
  Administered 2021-08-04 – 2021-08-06 (×3): 20 mg via ORAL
  Filled 2021-08-03 (×3): qty 2

## 2021-08-03 MED ORDER — NITROGLYCERIN 0.4 MG SL SUBL: 0.4000 mg | SUBLINGUAL_TABLET | SUBLINGUAL | Status: DC | PRN

## 2021-08-03 MED ORDER — SODIUM CHLORIDE 0.9 % IV SOLN: INTRAVENOUS | Status: DC

## 2021-08-03 MED ORDER — TRAZODONE HCL 50 MG PO TABS
25.0000 mg | ORAL_TABLET | Freq: Every evening | ORAL | Status: DC | PRN
Start: 2021-08-03 — End: 2021-08-05
  Administered 2021-08-04 (×2): 25 mg via ORAL
  Filled 2021-08-03 (×2): qty 1

## 2021-08-03 MED ORDER — INSULIN ASPART 100 UNIT/ML IJ SOLN
0.0000 [IU] | Freq: Three times a day (TID) | INTRAMUSCULAR | Status: DC
  Administered 2021-08-04: 11 [IU] via SUBCUTANEOUS

## 2021-08-03 MED ORDER — ALPRAZOLAM 0.25 MG PO TABS: 0.2500 mg | ORAL_TABLET | Freq: Two times a day (BID) | ORAL | Status: DC | PRN

## 2021-08-03 MED ORDER — CYCLOBENZAPRINE HCL 10 MG PO TABS: 10.0000 mg | ORAL_TABLET | Freq: Three times a day (TID) | ORAL | Status: DC | PRN

## 2021-08-03 MED ORDER — HYDROCHLOROTHIAZIDE 25 MG PO TABS: 25.0000 mg | ORAL_TABLET | Freq: Every day | ORAL | Status: DC

## 2021-08-03 MED ORDER — MAGNESIUM SULFATE 2 GM/50ML IV SOLN
2.0000 g | Freq: Once | INTRAVENOUS | Status: AC
  Administered 2021-08-03: 2 g via INTRAVENOUS
  Filled 2021-08-03: qty 50

## 2021-08-03 MED ORDER — LACTATED RINGERS IV BOLUS
1000.0000 mL | Freq: Once | INTRAVENOUS | Status: AC
  Administered 2021-08-03: 1000 mL via INTRAVENOUS

## 2021-08-03 MED ORDER — ALUM & MAG HYDROXIDE-SIMETH 200-200-20 MG/5ML PO SUSP
30.0000 mL | Freq: Once | ORAL | Status: AC
  Administered 2021-08-03: 30 mL via ORAL
  Filled 2021-08-03: qty 30

## 2021-08-03 MED ORDER — MAGNESIUM HYDROXIDE 400 MG/5ML PO SUSP: 30.0000 mL | Freq: Every day | ORAL | Status: DC | PRN

## 2021-08-03 MED ORDER — PANTOPRAZOLE SODIUM 40 MG PO TBEC: 40.0000 mg | DELAYED_RELEASE_TABLET | Freq: Every day | ORAL | Status: DC

## 2021-08-03 MED ORDER — OMEGA-3-ACID ETHYL ESTERS 1 G PO CAPS
2.0000 g | ORAL_CAPSULE | Freq: Two times a day (BID) | ORAL | Status: DC
  Administered 2021-08-03 – 2021-08-06 (×6): 2 g via ORAL
  Filled 2021-08-03 (×7): qty 2

## 2021-08-03 MED ORDER — ASPIRIN EC 81 MG PO TBEC
81.0000 mg | DELAYED_RELEASE_TABLET | Freq: Every day | ORAL | Status: DC
  Administered 2021-08-03: 81 mg via ORAL
  Filled 2021-08-03: qty 1

## 2021-08-03 NOTE — H&P (Signed)
?  ?  ?[place] ? ? ?PATIENT NAME: Brandon Conrad   ? ?MR#:  [id number] ? ?DATE OF BIRTH:  04-Feb-1956 ? ?DATE OF ADMISSION:  08/03/2021 ? ?PRIMARY CARE PHYSICIAN: Luetta Nutting, DO  ? ?Patient is coming from: Home ? ?REQUESTING/REFERRING PHYSICIAN: Lupita Dawn, MD  ? ?CHIEF COMPLAINT:  ? ?Chief Complaint  ?Patient presents with  ?? Hyperglycemia  ?? Chest Pain  ? ? ?HISTORY OF PRESENT ILLNESS:  ?Brandon Conrad is a 66 y.o. Caucasian male with medical history significant for anxiety, type 2 diabetes mellitus and hypertension, who presented to the emergency room with acute onset of midsternal chest pain while he was working on his computer today that he describes as pressure across the chest from shoulder to shoulder with mild radiation to his left arm.  He graded 10/10 in severity and it was associated with diaphoresis without nausea or vomiting.  He admitted to mild dyspnea and palpitations with it.  He denied any recent cough or wheezing.  No fever or chills.  He got significantly better after 4 baby aspirin.  He had his last cardiac stress test about a couple of years ago in [facility] and that was negative per his report.  No bleeding diathesis.  No dysuria, oliguria or hematuria or flank pain. ? ?ED Course: Upon presenting to the emergency room BP was 85/57 with otherwise normal vital signs.  Later on BP was up to 101/66.  Labs revealed sodium 129 with chloride 91 with blood glucose of 300, BUN of 26 and creatinine 2.1.  CBC showed hemoglobin of 12.2 and hematocrit 35.5, better than previous values.  High-sensitivity Troponin I was 7 and later 5 Hemoglobin A1c was 14.2 today ?EKG as reviewed by me : EKG showed sinus tachycardia with rate of 101 and right ventricular hypertrophy with minimal ST segment elevation in lateral lead likely early repolarization and T wave inversion inferiorly. ?Imaging: Portable chest x-ray showed no acute cardiopulmonary disease. ? ?The patient was given a GI cocktail here in the ER.   He will be admitted to an observation cardiac telemetry bed for further evaluation and management. ?PAST MEDICAL HISTORY:  ? ?Past Medical History:  ?Diagnosis Date  ?? Anxiety   ?? Hypertension   ?-Type 2 diabetes mellitus ? ?PAST SURGICAL HISTORY:  ? ?Past Surgical History:  ?Procedure Laterality Date  ?? APPENDECTOMY  2011  ? ? ?SOCIAL HISTORY:  ? ?Social History  ? ?Tobacco Use  ?? Smoking status: Former  ?  Packs/day: 0.25  ?  Types: Cigarettes  ?  Quit date: 02/16/2015  ?  Years since quitting: 6.4  ?? Smokeless tobacco: Never  ?? Tobacco comments:  ?  currently smoking 3 - 4 cigs/day  ?Substance Use Topics  ?? Alcohol use: Yes  ?  Alcohol/week: 12.0 standard drinks  ?  Types: 12 Standard drinks or equivalent per week  ?  Comment: 1-2 beers q day  ? ? ?FAMILY HISTORY:  ?History reviewed. No pertinent family history.  He denies any familial diseases. ?DRUG ALLERGIES:  ? ?Allergies  ?Allergen Reactions  ?? Bee Venom Anaphylaxis  ?? Cephalosporins Rash  ? ? ?REVIEW OF SYSTEMS:  ? ?ROS ?As per history of present illness. All pertinent systems were reviewed above. Constitutional, HEENT, cardiovascular, respiratory, GI, GU, musculoskeletal, neuro, psychiatric, endocrine, integumentary and hematologic systems were reviewed and are otherwise negative/unremarkable except for positive findings mentioned above in the HPI. ? ? ?MEDICATIONS AT HOME:  ? ?Prior to Admission medications   ?Medication  Sig Start Date End Date Taking? Authorizing Provider  ?aspirin 325 MG EC tablet Take 325 mg by mouth daily as needed for pain.   Yes [provider]  ?atorvastatin (LIPITOR) 20 MG tablet Take 0.5 tablets (10 mg total) by mouth daily. ?Patient taking differently: Take 20 mg by mouth daily. 01/12/21  Yes Emeterio Reeve, DO  ?cyclobenzaprine (FLEXERIL) 10 MG tablet TAKE 1/2 TO 1 TABLET BY MOUTH 3 TIMES A DAY AS NEEDED FOR MUSCLE SPASMS, MAY CAUSE DROWSINESS ?Patient taking differently: Take 10 mg by mouth 3 (three) times  daily as needed for muscle spasms. 07/05/21  Yes Luetta Nutting, DO  ?fenofibrate 160 MG tablet TAKE 1 TABLET BY MOUTH EVERY DAY ?Patient taking differently: Take 160 mg by mouth daily. 07/11/21  Yes Luetta Nutting, DO  ?hydrochlorothiazide (HYDRODIURIL) 25 MG tablet TAKE 1 TABLET BY MOUTH EVERY DAY ?Patient taking differently: Take 25 mg by mouth daily. 06/21/21  Yes Luetta Nutting, DO  ?metFORMIN (GLUCOPHAGE XR) 500 MG 24 hr tablet Take 1 tablet (500 mg total) by mouth daily with breakfast. 12/07/20 12/07/21 Yes Samuel Bouche, NP  ?omega-3 acid ethyl esters (LOVAZA) 1 g capsule Take 2 capsules (2 g total) by mouth 2 (two) times daily. 12/07/20  Yes Samuel Bouche, NP  ?omeprazole (PRILOSEC) 40 MG capsule TAKE 1 CAPSULE BY MOUTH EVERY DAY ?Patient taking differently: Take 40 mg by mouth daily. TAKE 1 CAPSULE BY MOUTH EVERY DAY 01/12/21  Yes Emeterio Reeve, DO  ?valsartan (DIOVAN) 320 MG tablet TAKE 1 TABLET BY MOUTH EVERY DAY ?Patient taking differently: Take 320 mg by mouth daily. 12/27/20  Yes Emeterio Reeve, DO  ? ?  ? ?VITAL SIGNS:  ?Blood pressure 113/86, pulse 92, temperature 97.8 ?F (36.6 ?C), resp. rate 18, SpO2 97 %. ? ?PHYSICAL EXAMINATION:  ?Physical Exam ?: ?GENERAL:  66 y.o.-year-old Caucasian male patient lying in the bed with no acute distress.  ?EYES: Pupils equal, round, reactive to light and accommodation. No scleral icterus. Extraocular muscles intact.  ?HEENT: Head atraumatic, normocephalic. Oropharynx and nasopharynx clear.  ?NECK:  Supple, no jugular venous distention. No thyroid enlargement, no tenderness.  ?LUNGS: Normal breath sounds bilaterally, no wheezing, rales,rhonchi or crepitation. No use of accessory muscles of respiration.  ?CARDIOVASCULAR: Regular rate and rhythm, S1, S2 normal. No murmurs, rubs, or gallops.  ?ABDOMEN: Soft, nondistended, nontender. Bowel sounds present. No organomegaly or mass.  ?EXTREMITIES: No pedal edema, cyanosis, or clubbing.  ?NEUROLOGIC: Cranial nerves II through  XII are intact. Muscle strength 5/5 in all extremities. Sensation intact. Gait not checked.  ?PSYCHIATRIC: The patient is alert and oriented x 3.  Normal affect and good eye contact. ?SKIN: No obvious rash, lesion, or ulcer.  ? ?LABORATORY PANEL:  ? ?CBC ?Recent Labs  ?Lab 08/03/21 ?2323  ?WBC 5.3  ?HGB 12.2*  ?HCT 35.5*  ?PLT 169  ? ?------------------------------------------------------------------------------------------------------------------ ? ?Chemistries  ?Recent Labs  ?Lab 08/03/21 ?1746 08/03/21 ?1824 08/03/21 ?2323  ?NA 129* 129*  --   ?K 3.9 3.9  --   ?CL 91* 91*  --   ?CO2 23  --   --   ?GLUCOSE 303* 300*  --   ?BUN 26* 26*  --   ?CREATININE 2.49* 2.10* 2.32*  ?CALCIUM 9.0  --   --   ?MG 1.3*  --   --   ?AST 45*  --   --   ?ALT 46*  --   --   ?ALKPHOS 30*  --   --   ?BILITOT 1.2  --   --   ? ?------------------------------------------------------------------------------------------------------------------ ? ?  Cardiac Enzymes ?No results for input(s): TROPONINI in the last 168 hours. ?------------------------------------------------------------------------------------------------------------------ ? ?RADIOLOGY:  ?DG Chest Portable 1 View ? ?Result Date: 08/03/2021 ?CLINICAL DATA:  Chest pain EXAM: PORTABLE CHEST 1 VIEW COMPARISON:  August 24, 2019 FINDINGS: The heart size and mediastinal contours are within normal limits. No focal airspace consolidation. No pleural effusion. No pneumothorax. The visualized skeletal structures are unremarkable. IMPRESSION: No acute cardiopulmonary findings. Electronically Signed   By: Dahlia Bailiff M.D.   On: 08/03/2021 18:39  ? ?CT CHEST ABDOMEN PELVIS WO CONTRAST ? ?Result Date: 08/03/2021 ?CLINICAL DATA:  Aortic aneurysm EXAM: CT CHEST, ABDOMEN AND PELVIS WITHOUT CONTRAST TECHNIQUE: Multidetector CT imaging of the chest, abdomen and pelvis was performed following the standard protocol without IV contrast. RADIATION DOSE REDUCTION: This exam was performed according to the  departmental dose-optimization program which includes automated exposure control, adjustment of the mA and/or kV according to patient size and/or use of iterative reconstruction technique. COMPARISON:

## 2021-08-03 NOTE — ED Provider Notes (Signed)
?[facility] ?Provider Note ? ? ?CSN: [id number] ?Arrival date & time: 08/03/21  1729 ? ?  ? ?History ?Chief Complaint  ?Patient presents with  ? Hyperglycemia  ? Chest Pain  ? ? ?Brandon DiverScott Conrad is a 66 y.o. male with HTN, hypertriglyceridemia, type 2 diabetes  (last hemoglobin A1c of 7.1, takes metformin) presenting to the emergency department for elevated blood glucose and chest pain.  [facility]  EMS reports he became cool, pale, diaphoretic when they arrived on scene and his blood pressures were initially 64/38.  He got a normal saline bolus and his blood pressures improved to 92/68.  Patient had chest pain approximately hour before arrival that was between his shoulders and radiated to his left elbow and up to his neck.  Reports that is now gone.  He had a sensation that it felt like it was the end.  He reports history of vasovagal issues since he was young but has not had anything that felt as bad as this. ? ? ?Hyperglycemia ?Associated symptoms: chest pain   ?Associated symptoms: no abdominal pain, no fatigue, no fever, no nausea and no weakness   ?Chest Pain ?Associated symptoms: no abdominal pain, no back pain, no fatigue, no fever, no nausea, no palpitations and no weakness   ? ?HPI: A 66 year old patient with a history of treated diabetes, hypercholesterolemia and obesity presents for evaluation of chest pain. Initial onset of pain was less than one hour ago. The patient's chest pain is described as heaviness/pressure/tightness and is not worse with exertion. The patient reports some diaphoresis. The patient's chest pain is middle- or left-sided, is not well-localized, is not sharp and does radiate to the arms/jaw/neck. The patient does not complain of nausea. The patient has no history of stroke, has no history of peripheral artery disease, has not smoked in the past 90 days, has no relevant family history of coronary  artery disease (first degree relative at less than age 64) and is not hypertensive.  ? ?Home Medications ?Prior to Admission medications   ?Medication Sig Start Date End Date Taking? Authorizing Provider  ?aspirin 325 MG EC tablet Take 325 mg by mouth daily as needed for pain.   Yes [provider]  ?atorvastatin (LIPITOR) 20 MG tablet Take 0.5 tablets (10 mg total) by mouth daily. ?Patient taking differently: Take 20 mg by mouth daily. 01/12/21  Yes Sunnie NielsenAlexander, Natalie, DO  ?cyclobenzaprine (FLEXERIL) 10 MG tablet TAKE 1/2 TO 1 TABLET BY MOUTH 3 TIMES A DAY AS NEEDED FOR MUSCLE SPASMS, MAY CAUSE DROWSINESS ?Patient taking differently: Take 10 mg by mouth 3 (three) times daily as needed for muscle spasms. 07/05/21  Yes Everrett CoombeMatthews, Cody, DO  ?fenofibrate 160 MG tablet TAKE 1 TABLET BY MOUTH EVERY DAY ?Patient taking differently: Take 160 mg by mouth daily. 07/11/21  Yes Everrett CoombeMatthews, Cody, DO  ?hydrochlorothiazide (HYDRODIURIL) 25 MG tablet TAKE 1 TABLET BY MOUTH EVERY DAY ?Patient taking differently: Take 25 mg by mouth daily. 06/21/21  Yes Everrett CoombeMatthews, Cody, DO  ?metFORMIN (GLUCOPHAGE XR) 500 MG 24 hr tablet Take 1 tablet (500 mg total) by mouth daily with breakfast. 12/07/20 12/07/21 Yes Christen ButterJessup, Joy, NP  ?omega-3 acid ethyl esters (LOVAZA) 1 g capsule Take 2 capsules (2 g total) by mouth 2 (two) times daily. 12/07/20  Yes Christen ButterJessup, Joy, NP  ?omeprazole (PRILOSEC) 40 MG capsule TAKE 1 CAPSULE BY MOUTH EVERY DAY ?Patient taking differently: Take 40 mg by mouth  daily. TAKE 1 CAPSULE BY MOUTH EVERY DAY 01/12/21  Yes Sunnie Nielsen, DO  ?valsartan (DIOVAN) 320 MG tablet TAKE 1 TABLET BY MOUTH EVERY DAY ?Patient taking differently: Take 320 mg by mouth daily. 12/27/20  Yes Sunnie Nielsen, DO  ?   ? ?Allergies    ?Bee venom and Cephalosporins   ? ?Review of Systems   ?Review of Systems  ?Constitutional:  Negative for fatigue and fever.  ?HENT:  Negative for congestion and ear pain.   ?Cardiovascular:  Positive for chest pain.  Negative for palpitations.  ?Gastrointestinal:  Negative for abdominal pain and nausea.  ?Musculoskeletal:  Negative for arthralgias and back pain.  ?Skin:  Negative for rash and wound.  ?Neurological:  Negative for tremors and weakness.  ?Psychiatric/Behavioral:  Negative for agitation and behavioral problems.   ? ?Physical Exam ?Updated Vital Signs ?BP 101/66   Pulse 88   Temp 97.8 ?F (36.6 ?C)   Resp 19   SpO2 99%  ?Physical Exam ?Vitals and nursing note reviewed.  ?Constitutional:   ?   Appearance: He is not ill-appearing or diaphoretic.  ?Cardiovascular:  ?   Rate and Rhythm: Normal rate and regular rhythm.  ?   Heart sounds: Normal heart sounds.  ?Pulmonary:  ?   Effort: No tachypnea or respiratory distress.  ?   Breath sounds: No decreased breath sounds.  ?Neurological:  ?   Mental Status: He is alert and oriented to person, place, and time.  ?Psychiatric:     ?   Mood and Affect: Mood normal.     ?   Behavior: Behavior normal.  ? ? ?ED Results / Procedures / Treatments   ?Labs ?(all labs ordered are listed, but only abnormal results are displayed) ?Labs Reviewed  ?CBC WITH DIFFERENTIAL/PLATELET - Abnormal; Notable for the following components:  ?    Result Value  ? RBC 3.76 (*)   ? Hemoglobin 11.9 (*)   ? HCT 35.1 (*)   ? Monocytes Absolute 1.3 (*)   ? Abs Immature Granulocytes 0.08 (*)   ? All other components within normal limits  ?COMPREHENSIVE METABOLIC PANEL - Abnormal; Notable for the following components:  ? Sodium 129 (*)   ? Chloride 91 (*)   ? Glucose, Bld 303 (*)   ? BUN 26 (*)   ? Creatinine, Ser 2.49 (*)   ? Total Protein 6.3 (*)   ? AST 45 (*)   ? ALT 46 (*)   ? Alkaline Phosphatase 30 (*)   ? GFR, Estimated 28 (*)   ? All other components within normal limits  ?MAGNESIUM - Abnormal; Notable for the following components:  ? Magnesium 1.3 (*)   ? All other components within normal limits  ?CBG MONITORING, ED - Abnormal; Notable for the following components:  ? Glucose-Capillary 298 (*)   ?  All other components within normal limits  ?I-STAT CHEM 8, ED - Abnormal; Notable for the following components:  ? Sodium 129 (*)   ? Chloride 91 (*)   ? BUN 26 (*)   ? Creatinine, Ser 2.10 (*)   ? Glucose, Bld 300 (*)   ? Calcium, Ion 1.12 (*)   ? Hemoglobin 12.6 (*)   ? HCT 37.0 (*)   ? All other components within normal limits  ?BRAIN NATRIURETIC PEPTIDE  ?TROPONIN I (HIGH SENSITIVITY)  ?TROPONIN I (HIGH SENSITIVITY)  ? ? ?EKG ?EKG Interpretation ? ?Date/Time:  Thursday August 03 2021 17:29:50 EDT ?Ventricular Rate:  101 ?PR Interval:  159 ?QRS Duration: 95 ?QT Interval:  331 ?QTC Calculation: 429 ?R Axis:   96 ?Text Interpretation: Sinus tachycardia Consider right ventricular hypertrophy Minimal ST elevation, lateral leads No previous ECGs available Confirmed by Richardean Canal [phone]) on 08/03/2021 5:53:39 PM ? ?Radiology ?DG Chest Portable 1 View ? ?Result Date: 08/03/2021 ?CLINICAL DATA:  Chest pain EXAM: PORTABLE CHEST 1 VIEW COMPARISON:  August 24, 2019 FINDINGS: The heart size and mediastinal contours are within normal limits. No focal airspace consolidation. No pleural effusion. No pneumothorax. The visualized skeletal structures are unremarkable. IMPRESSION: No acute cardiopulmonary findings. Electronically Signed   By: Maudry Mayhew M.D.   On: 08/03/2021 18:39  ? ?CT CHEST ABDOMEN PELVIS WO CONTRAST ? ?Result Date: 08/03/2021 ?CLINICAL DATA:  Aortic aneurysm EXAM: CT CHEST, ABDOMEN AND PELVIS WITHOUT CONTRAST TECHNIQUE: Multidetector CT imaging of the chest, abdomen and pelvis was performed following the standard protocol without IV contrast. RADIATION DOSE REDUCTION: This exam was performed according to the departmental dose-optimization program which includes automated exposure control, adjustment of the mA and/or kV according to patient size and/or use of iterative reconstruction technique. COMPARISON:  None. FINDINGS: CT CHEST FINDINGS Cardiovascular: No significant vascular findings. Normal heart size. No  pericardial effusion. Mediastinum/Nodes: No enlarged mediastinal, hilar, or axillary lymph nodes. Thyroid gland, trachea, and esophagus demonstrate no significant findings. Lungs/Pleura: Lungs are clear. No p

## 2021-08-03 NOTE — ED Triage Notes (Signed)
Pt BIB [facility] with c/o hyperglycemia & CP. Pt reports his CBG has been hard to maintain lately & recently was in the 400's on his last hospital visit & the most recent check was 364. EMS reports he was cool/pale/diaphoretic upon their arrival & his pressures were soft. They were initially 64/38 & after given NS bolus in 20g PIV in Lt AC it rose to 92/68. A/Ox4, verbal- able to make needs known. Describes the CP he feels to be going between both of his shoulders & down to his Left elbow.  ?

## 2021-08-04 DIAGNOSIS — R7989 Other specified abnormal findings of blood chemistry: Secondary | ICD-10-CM

## 2021-08-04 DIAGNOSIS — E781 Pure hyperglyceridemia: Secondary | ICD-10-CM

## 2021-08-04 DIAGNOSIS — E1165 Type 2 diabetes mellitus with hyperglycemia: Secondary | ICD-10-CM | POA: Diagnosis present

## 2021-08-04 DIAGNOSIS — E871 Hypo-osmolality and hyponatremia: Secondary | ICD-10-CM

## 2021-08-04 DIAGNOSIS — E1169 Type 2 diabetes mellitus with other specified complication: Secondary | ICD-10-CM

## 2021-08-04 DIAGNOSIS — N179 Acute kidney failure, unspecified: Secondary | ICD-10-CM | POA: Diagnosis present

## 2021-08-04 DIAGNOSIS — I959 Hypotension, unspecified: Secondary | ICD-10-CM | POA: Diagnosis present

## 2021-08-04 DIAGNOSIS — R072 Precordial pain: Secondary | ICD-10-CM

## 2021-08-04 DIAGNOSIS — I10 Essential (primary) hypertension: Secondary | ICD-10-CM

## 2021-08-04 DIAGNOSIS — E785 Hyperlipidemia, unspecified: Secondary | ICD-10-CM

## 2021-08-04 DIAGNOSIS — I952 Hypotension due to drugs: Secondary | ICD-10-CM

## 2021-08-04 HISTORY — DX: Type 2 diabetes mellitus with hyperglycemia: E11.65

## 2021-08-04 HISTORY — DX: Pure hyperglyceridemia: E78.1

## 2021-08-04 HISTORY — DX: Hypomagnesemia: E83.42

## 2021-08-04 HISTORY — DX: Other specified abnormal findings of blood chemistry: R79.89

## 2021-08-04 HISTORY — DX: Hypo-osmolality and hyponatremia: E87.1

## 2021-08-04 LAB — COMPREHENSIVE METABOLIC PANEL
ALT: 42 U/L (ref 0–44)
AST: 40 U/L (ref 15–41)
Albumin: 3.6 g/dL (ref 3.5–5.0)
Alkaline Phosphatase: 29 U/L — ABNORMAL LOW (ref 38–126)
Anion gap: 14 (ref 5–15)
BUN: 26 mg/dL — ABNORMAL HIGH (ref 8–23)
CO2: 24 mmol/L (ref 22–32)
Calcium: 9 mg/dL (ref 8.9–10.3)
Chloride: 94 mmol/L — ABNORMAL LOW (ref 98–111)
Creatinine, Ser: 2.05 mg/dL — ABNORMAL HIGH (ref 0.61–1.24)
GFR, Estimated: 36 mL/min — ABNORMAL LOW (ref >60)
Glucose, Bld: 286 mg/dL — ABNORMAL HIGH (ref 70–99)
Potassium: 3.8 mmol/L (ref 3.5–5.1)
Sodium: 132 mmol/L — ABNORMAL LOW (ref 135–145)
Total Bilirubin: 1 mg/dL (ref 0.3–1.2)
Total Protein: 6.3 g/dL — ABNORMAL LOW (ref 6.5–8.1)

## 2021-08-04 LAB — URINALYSIS, COMPLETE (UACMP) WITH MICROSCOPIC
Bacteria, UA: NONE SEEN
Bilirubin Urine: NEGATIVE
Glucose, UA: >=500 mg/dL — AB
Hgb urine dipstick: NEGATIVE
Ketones, ur: 5 mg/dL — AB
Leukocytes,Ua: NEGATIVE
Nitrite: NEGATIVE
Protein, ur: NEGATIVE mg/dL
Specific Gravity, Urine: 1.012 (ref 1.005–1.030)
pH: 5 (ref 5.0–8.0)

## 2021-08-04 LAB — HIV ANTIBODY (ROUTINE TESTING W REFLEX): HIV Screen 4th Generation wRfx: NONREACTIVE

## 2021-08-04 LAB — LIPASE, BLOOD: Lipase: 38 U/L (ref 11–51)

## 2021-08-04 LAB — TROPONIN I (HIGH SENSITIVITY): Troponin I (High Sensitivity): 5 ng/L (ref <18)

## 2021-08-04 LAB — TRIGLYCERIDES: Triglycerides: 403 mg/dL — ABNORMAL HIGH (ref <150)

## 2021-08-04 LAB — CBG MONITORING, ED: Glucose-Capillary: 170 mg/dL — ABNORMAL HIGH (ref 70–99)

## 2021-08-04 LAB — OSMOLALITY: Osmolality: 304 mOsm/kg — ABNORMAL HIGH (ref 275–295)

## 2021-08-04 LAB — GLUCOSE, CAPILLARY
Glucose-Capillary: 180 mg/dL — ABNORMAL HIGH (ref 70–99)
Glucose-Capillary: 290 mg/dL — ABNORMAL HIGH (ref 70–99)

## 2021-08-04 LAB — MAGNESIUM: Magnesium: 1.8 mg/dL (ref 1.7–2.4)

## 2021-08-04 LAB — D-DIMER, QUANTITATIVE: D-Dimer, Quant: 0.37 ug/mL-FEU (ref 0.00–0.50)

## 2021-08-04 MED ORDER — LIVING WELL WITH DIABETES BOOK
Freq: Once | Status: AC
  Filled 2021-08-04: qty 1

## 2021-08-04 MED ORDER — INSULIN ASPART 100 UNIT/ML IJ SOLN
0.0000 [IU] | Freq: Three times a day (TID) | INTRAMUSCULAR | Status: DC
  Administered 2021-08-04: 3 [IU] via SUBCUTANEOUS
  Administered 2021-08-04: 2 [IU] via SUBCUTANEOUS
  Administered 2021-08-05 – 2021-08-06 (×5): 3 [IU] via SUBCUTANEOUS

## 2021-08-04 MED ORDER — ASPIRIN EC 325 MG PO TBEC
325.0000 mg | DELAYED_RELEASE_TABLET | Freq: Every day | ORAL | Status: DC
  Administered 2021-08-04 – 2021-08-06 (×3): 325 mg via ORAL
  Filled 2021-08-04 (×3): qty 1

## 2021-08-04 MED ORDER — FOLIC ACID 1 MG PO TABS
1.0000 mg | ORAL_TABLET | Freq: Every day | ORAL | Status: DC
  Administered 2021-08-04 – 2021-08-06 (×3): 1 mg via ORAL
  Filled 2021-08-04 (×3): qty 1

## 2021-08-04 MED ORDER — INSULIN ASPART 100 UNIT/ML IJ SOLN
4.0000 [IU] | Freq: Three times a day (TID) | INTRAMUSCULAR | Status: DC
  Administered 2021-08-04 – 2021-08-06 (×5): 4 [IU] via SUBCUTANEOUS

## 2021-08-04 MED ORDER — INSULIN GLARGINE-YFGN 100 UNIT/ML ~~LOC~~ SOLN
8.0000 [IU] | Freq: Every day | SUBCUTANEOUS | Status: DC
  Administered 2021-08-04: 8 [IU] via SUBCUTANEOUS
  Filled 2021-08-04 (×2): qty 0.08

## 2021-08-04 MED ORDER — ADULT MULTIVITAMIN W/MINERALS CH
1.0000 | ORAL_TABLET | Freq: Every day | ORAL | Status: DC
  Administered 2021-08-04 – 2021-08-06 (×3): 1 via ORAL
  Filled 2021-08-04 (×3): qty 1

## 2021-08-04 MED ORDER — THIAMINE HCL 100 MG PO TABS
100.0000 mg | ORAL_TABLET | Freq: Every day | ORAL | Status: DC
  Administered 2021-08-04 – 2021-08-06 (×3): 100 mg via ORAL
  Filled 2021-08-04 (×3): qty 1

## 2021-08-04 MED ORDER — LORAZEPAM 2 MG/ML IJ SOLN: 0.0000 mg | Freq: Two times a day (BID) | INTRAMUSCULAR | Status: DC

## 2021-08-04 MED ORDER — PANTOPRAZOLE SODIUM 40 MG PO TBEC
40.0000 mg | DELAYED_RELEASE_TABLET | Freq: Two times a day (BID) | ORAL | Status: DC
  Administered 2021-08-04 – 2021-08-06 (×5): 40 mg via ORAL
  Filled 2021-08-04 (×5): qty 1

## 2021-08-04 MED ORDER — LORAZEPAM 1 MG PO TABS: 1.0000 mg | ORAL_TABLET | ORAL | Status: DC | PRN

## 2021-08-04 MED ORDER — INSULIN ASPART 100 UNIT/ML IJ SOLN: 0.0000 [IU] | INTRAMUSCULAR | Status: DC

## 2021-08-04 MED ORDER — LORAZEPAM 2 MG/ML IJ SOLN: 1.0000 mg | INTRAMUSCULAR | Status: DC | PRN

## 2021-08-04 MED ORDER — SUCRALFATE 1 GM/10ML PO SUSP
1.0000 g | Freq: Two times a day (BID) | ORAL | Status: DC
  Administered 2021-08-04 – 2021-08-06 (×5): 1 g via ORAL
  Filled 2021-08-04 (×5): qty 10

## 2021-08-04 MED ORDER — INSULIN ASPART 100 UNIT/ML IJ SOLN
0.0000 [IU] | Freq: Every day | INTRAMUSCULAR | Status: DC
  Administered 2021-08-04: 3 [IU] via SUBCUTANEOUS
  Administered 2021-08-05: 2 [IU] via SUBCUTANEOUS

## 2021-08-04 MED ORDER — LORAZEPAM 2 MG/ML IJ SOLN: 0.0000 mg | Freq: Four times a day (QID) | INTRAMUSCULAR | Status: AC

## 2021-08-04 MED ORDER — INSULIN STARTER KIT- PEN NEEDLES (ENGLISH)
1.0000 | Freq: Once | Status: AC
  Administered 2021-08-04: 1
  Filled 2021-08-04: qty 1

## 2021-08-04 MED ORDER — INSULIN ASPART 100 UNIT/ML IJ SOLN: 0.0000 [IU] | Freq: Three times a day (TID) | INTRAMUSCULAR | Status: DC

## 2021-08-04 MED ORDER — THIAMINE HCL 100 MG/ML IJ SOLN: 100.0000 mg | Freq: Every day | INTRAMUSCULAR | Status: DC

## 2021-08-04 NOTE — Assessment & Plan Note (Deleted)
-   We will continue statin therapy as well as fenofibrate. 

## 2021-08-04 NOTE — Assessment & Plan Note (Addendum)
Most likely dehydration in the setting of hyperglycemia. ?Patient is on Diovan HCTZ outpatient which is currently on hold. ?

## 2021-08-04 NOTE — Assessment & Plan Note (Addendum)
Most likely from ongoing hypotension and dehydration from hyperglycemia. ?Elicited during IV fluids ?CT abdomen pelvis was negative for any obstruction or hydronephrosis. ?If renal function does not improve will request nephro consultation. ?

## 2021-08-04 NOTE — Assessment & Plan Note (Addendum)
Appreciate cardiology consultation. ?Serial troponins are negative. ?No intervention planned inpatient. ?Most likely this pain appears to be GI in origin. ?Will treat aggressively with PPI, Carafate. ?Lipase level is negative.  Lactic acid level negative. ?Continue aspirin. ?

## 2021-08-04 NOTE — Assessment & Plan Note (Addendum)
Hemoglobin A1c more than 14. ?Patient will most likely require insulin therapy on discharge. ?Currently on sliding scale insulin and Lantus.  Monitor. ?Home regimen include metformin 500 mg daily, most likely related to add SGLT2 inhibitor due to his cardiovascular risk. ?

## 2021-08-04 NOTE — Progress Notes (Addendum)
Inpatient Diabetes Program Recommendations ? ?AACE/ADA: New Consensus Statement on Inpatient Glycemic Control  ? ?Target Ranges:  Prepandial:   less than 140 mg/dL ?     Peak postprandial:   less than 180 mg/dL (1-2 hours) ?     Critically ill patients:  140 - 180 mg/dL  ? ? Latest Reference Range & Units 08/04/21 08:50  ?Glucose 70 - 99 mg/dL 286 (H)  ? ? Latest Reference Range & Units 08/03/21 18:17  ?Glucose-Capillary 70 - 99 mg/dL 298 (H)  ? ? Latest Reference Range & Units 05/17/21 00:00 08/03/21 23:23  ?Hemoglobin A1C 4.8 - 5.6 % 9.5 (H) 14.2 (H)  ? ?Review of Glycemic Control ? ?Diabetes history: DM2 ?Outpatient Diabetes medications: Metformin XR 500 mg QAM ?Current orders for Inpatient glycemic control: Novolog 0-9 units Q4H ? ?Inpatient Diabetes Program Recommendations:   ? ?Insulin: If glucose remains consistently over 180 mg/dl, please consider ordering Semglee 10 units Q24H. ? ?HbgA1C:  A1C 14.2% on 08/03/21 indicating an average glucose of 361 mg/dl over the past 2-3 months. If patient is discharged on insulin, he prefers insulin pens (insurance covers [employer] [phone]), Novolog Flexpens [phone]), insulin pen needles [phone])). Please provide Rx for glucose monitoring kit (#[id number]). ? ?NOTE: Per chart, patient's A1C was 9.5% on 05/17/21 and currently 14.2% on 08/03/21. Patient will need additional DM medications prescribed at time of discharge. ? ?Addendum 08/04/21@14 :15-Spoke with patient at bedside about diabetes and home regimen for diabetes control. Patient reports he was dx with DM2 in October 2022 and he was started on Metformin. Patient reports that he was not given any type of formal DM education.  Patient is being followed by PCP for diabetes management and currently taking Metformin XR 500 mg QAM as an outpatient for diabetes control. Patient reports taking DM medications as prescribed. Patient has not been checking glucose because he was not asked to do so and he does not have a  glucometer or testing supplies. Inquired about knowledge of an A1C and patient does not know what an A1C is.  Discussed A1C results (14.2% on 08/03/21) and explained what an A1C is and informed patient that his current A1C indicates an average glucose of 361 mg/dl over the past 2-3 months. Explained that prior A1C was 9.5% on 05/17/21.  Discussed basic pathophysiology of DM Type 2, basic home care, importance of checking CBGs and maintaining good CBG control to prevent long-term and short-term complications. Explained how hyperglycemia leads to damage within blood vessels which lead to the common complications seen with uncontrolled diabetes. Reviewed glucose and A1C goals. Reviewed signs and symptoms of hyperglycemia and hypoglycemia along with treatment for both. Discussed impact of nutrition, exercise, stress, sickness, and medications on diabetes control. Discussed carbohydrates, carbohydrate goals per day and meal, along with portion sizes. Informed patient that RD would be consulted for further diet education. Encouraged patient to check glucose 3-4 times per day and to keep a log book of glucose readings which patient will need to take to doctor appointments. Explained how the doctor can use the log book to continue to make adjustments with DM medications if needed.  Explained that he may need insulin outpatient given A1C and I wanted to provide education on insulin just in case insulin is prescribed at discharge. Discussed basal and short acting insulin. Explained that while inpatient, he will be given insulin. Reviewed and demonstrated how to use insulin vial/syringe and insulin pens. Patient states that he would prefer to use insulin pens if  he is discharged on insulin. Educated patient on insulin pen use at home.  Reviewed all steps of insulin pen including attachment of needle, 2-unit air shot, dialing up dose, giving injection, removing needle, disposal of sharps, storage of unused insulin, disposal of  insulin etc. Patient able to provide successful return demonstration. Informed patient that RN will be asking him to self-administer insulin to ensure proper technique and ability to administer self insulin shots. Patient was able to self inject Novolog correction insulin during visit without any issues.  Patient verbalized understanding of information discussed and he states that he has no further questions at this time related to diabetes.   RNs to provide ongoing basic DM education at bedside with this patient and engage patient to actively check blood glucose and administer insulin injections.  At time of discharge, please provide Rx for glucose monitoring kit (#[id number]). If patient will be discharged on insulin, please provide Rx for insulin pens and insulin pen needles. Current insurance covers: [employer] [phone]) and Eugenia Mcalpine [phone]). ? ?Thanks, ?Barnie Alderman, RN, MSN, CDE ?Diabetes Coordinator ?Inpatient Diabetes Program ?[phone] (Team Pager from 8am to 5pm) ? ? ?

## 2021-08-04 NOTE — Assessment & Plan Note (Addendum)
Blood pressure stable without any medication. ?Holding off on HCTZ and Diovan. ?Monitor. ?

## 2021-08-04 NOTE — Hospital Course (Signed)
Past medical history of HTN, type 2 diabetes mellitus presents with complaints of chest pain as well as hypertension. ?Found to have severe acute kidney injury. ?Currently requiring aggressive IV therapy.  Cardiology consulted ACS ruled out.  No further intervention planned. ?

## 2021-08-04 NOTE — Assessment & Plan Note (Addendum)
On fenofibrate.  Resume. ?

## 2021-08-04 NOTE — ED Notes (Signed)
I went into pt's room and he had an empty [place] bag. Pt stated he was given the [place] bag at approx 0200 this morning by the prior shift. ?

## 2021-08-04 NOTE — ED Notes (Signed)
Pt is NSR on monitor 

## 2021-08-04 NOTE — Assessment & Plan Note (Signed)
Continue statin. 

## 2021-08-04 NOTE — Assessment & Plan Note (Signed)
Minimally elevated. ?For now monitor. ?If worsens further we will have to hold statin.  Likely in response to hypotension. ?

## 2021-08-04 NOTE — Plan of Care (Signed)
?  RD consulted for nutrition education regarding diabetes.  ? ?Lab Results  ?Component Value Date  ? HGBA1C 14.2 (H) 08/03/2021  ? ?RD provided "Carbohydrate Counting for People with Diabetes" handout from the Academy of Nutrition and Dietetics. Discussed different food groups and their effects on blood sugar, emphasizing carbohydrate-containing foods. Provided list of carbohydrates and recommended serving sizes of common foods. ? ?Discussed importance of controlled and consistent carbohydrate intake throughout the day. Provided examples of ways to balance meals/snacks and encouraged intake of high-fiber, whole grain complex carbohydrates. Teach back method used. ? ?Pt very motivated, will place consult for outpatient DM education. Expect excellent compliance. ? ?Body mass index is 32.7 kg/m?Marland Kitchen Pt meets criteria for obesity class I based on current BMI. ? ?Current diet order is carb modified. Labs and medications reviewed. No further nutrition interventions warranted at this time. RD contact information provided. If additional nutrition issues arise, please re-consult RD. ? ?Greig Castilla, RD, LDN ?Clinical Dietitian ?RD pager # available in [facility]  ?After hours/weekend pager # available in [facility]ION ?

## 2021-08-04 NOTE — Consult Note (Addendum)
Cardiology Consultation:   Patient ID: Brandon Conrad MRN: [id number]; DOB: 1955-12-16  Admit date: 08/03/2021 Date of Consult: 08/04/2021  PCP:  Everrett Coombe, DO   [facility] Providers Cardiologist:  None   New   Patient Profile:   Brandon Conrad is a 66 y.o. male with a hx of anxiety, DM2 and HTN who is being seen 08/04/2021 for the evaluation of chest pain at the request of Dr. Allena Katz.  History of Present Illness:   Brandon Conrad is a 66 yo male with PMH noted above. He has never been elevated by cardiology in the past.  Did undergo stress testing 08/2019 through [facility] which showed no reversible ischemia or infarct, LVEF of 73%.  Presented to the ER on 3/23 with complaints of midsternal chest pain while working at his computer.  States pain radiated across his chest from shoulder to shoulder and then into his left arm.  Rated 10 out of 10 in severity and had associated diaphoresis.  He did feel mildly short of breath and had palpitations.  In the ED his labs showed sodium 129, potassium 3.9, creatinine 2.49, magnesium 1.3, AST 45, ALT 46, BNP 4, high-sensitivity troponin 7>>5>>5, WBC 7, hemoglobin 11.9.  Chest x-ray negative.  EKG shows sinus tachycardia, 101 bpm, slight ST elevation in lead II, T wave inversion in lead III and aVF.  He was also found to be significantly hypotensive with systolic pressures in the 80s.  He was started on IV fluids and admitted to internal medicine for further management.  His home HCTZ and Diovan were held given his AKI on admission.  In talking with the patient he reports trying to do better with his health recently he has been going to the gym and walking on the treadmill multiple times a week.  He has not had any anginal symptoms with this activity.  States he has just felt generally "unwell" over the past couple weeks.  Has been lightheaded and dizzy at times.  He does not routinely check his blood pressure at home.  States he has had a lot of  trouble controlling his blood sugars.  He has been treated with metformin 500 mg daily, but reports his hemoglobin A1c has not been well controlled.   Apparently leading up to this, he was doing pretty well at the gym.  He been doing fine until the episode yesterday.  He is feeling comfortable now.  Past Medical History:  Diagnosis Date   Anxiety    Hypertension     Past Surgical History:  Procedure Laterality Date   APPENDECTOMY  2011     Home Medications:  Prior to Admission medications   Medication Sig Start Date End Date Taking? Authorizing Provider  aspirin 325 MG EC tablet Take 325 mg by mouth daily as needed for pain.   Yes [provider]  atorvastatin (LIPITOR) 20 MG tablet Take 0.5 tablets (10 mg total) by mouth daily. Patient taking differently: Take 20 mg by mouth daily. 01/12/21  Yes Sunnie Nielsen, DO  cyclobenzaprine (FLEXERIL) 10 MG tablet TAKE 1/2 TO 1 TABLET BY MOUTH 3 TIMES A DAY AS NEEDED FOR MUSCLE SPASMS, MAY CAUSE DROWSINESS Patient taking differently: Take 10 mg by mouth 3 (three) times daily as needed for muscle spasms. 07/05/21  Yes Everrett Coombe, DO  fenofibrate 160 MG tablet TAKE 1 TABLET BY MOUTH EVERY DAY Patient taking differently: Take 160 mg by mouth daily. 07/11/21  Yes Everrett Coombe, DO  hydrochlorothiazide (HYDRODIURIL) 25 MG  tablet TAKE 1 TABLET BY MOUTH EVERY DAY Patient taking differently: Take 25 mg by mouth daily. 06/21/21  Yes Everrett Coombe, DO  metFORMIN (GLUCOPHAGE XR) 500 MG 24 hr tablet Take 1 tablet (500 mg total) by mouth daily with breakfast. 12/07/20 12/07/21 Yes Christen Butter, NP  omega-3 acid ethyl esters (LOVAZA) 1 g capsule Take 2 capsules (2 g total) by mouth 2 (two) times daily. 12/07/20  Yes Christen Butter, NP  omeprazole (PRILOSEC) 40 MG capsule TAKE 1 CAPSULE BY MOUTH EVERY DAY Patient taking differently: Take 40 mg by mouth daily. TAKE 1 CAPSULE BY MOUTH EVERY DAY 01/12/21  Yes Sunnie Nielsen, DO  valsartan (DIOVAN) 320 MG  tablet TAKE 1 TABLET BY MOUTH EVERY DAY Patient taking differently: Take 320 mg by mouth daily. 12/27/20  Yes Sunnie Nielsen, DO    Inpatient Medications: Scheduled Meds:  aspirin  325 mg Oral Daily   atorvastatin  20 mg Oral Daily   enoxaparin (LOVENOX) injection  40 mg Subcutaneous Q24H   folic acid  1 mg Oral Daily   insulin aspart  0-9 Units Subcutaneous Q4H   LORazepam  0-4 mg Intravenous Q6H   Followed by   Melene Muller ON 08/06/2021] LORazepam  0-4 mg Intravenous Q12H   multivitamin with minerals  1 tablet Oral Daily   omega-3 acid ethyl esters  2 g Oral BID   pantoprazole  40 mg Oral BID AC   sucralfate  1 g Oral BID   thiamine  100 mg Oral Daily   Or   thiamine  100 mg Intravenous Daily   Continuous Infusions:  sodium chloride 100 mL/hr at 08/04/21 0151   PRN Meds: acetaminophen, cyclobenzaprine, LORazepam **OR** LORazepam, magnesium hydroxide, morphine injection, nitroGLYCERIN, ondansetron (ZOFRAN) IV, traZODone  Allergies:    Allergies  Allergen Reactions   Bee Venom Anaphylaxis   Cephalosporins Rash    Social History:   Social History   Socioeconomic History   Marital status: Divorced    Spouse name: Not on file   Number of children: Not on file   Years of education: Not on file   Highest education level: Not on file  Occupational History   Not on file  Tobacco Use   Smoking status: Former    Packs/day: 0.25    Types: Cigarettes    Quit date: 02/16/2015    Years since quitting: 6.4   Smokeless tobacco: Never   Tobacco comments:    currently smoking 3 - 4 cigs/day  Substance and Sexual Activity   Alcohol use: Yes    Alcohol/week: 12.0 standard drinks    Types: 12 Standard drinks or equivalent per week    Comment: 1-2 beers q day   Drug use: No   Sexual activity: Yes    Partners: Female  Other Topics Concern   Not on file  Social History Narrative   Not on file   Social Determinants of Health   Financial Resource Strain: Not on file  Food  Insecurity: Not on file  Transportation Needs: Not on file  Physical Activity: Not on file  Stress: Not on file  Social Connections: Not on file  Intimate Partner Violence: Not on file    Family History:   History reviewed. No pertinent family history.   ROS:  Please see the history of present illness.   All other ROS reviewed and negative.     Physical Exam/Data:   Vitals:   08/04/21 0740 08/04/21 0747 08/04/21 0800 08/04/21 1031  BP: 115/63  124/79 116/90  Pulse: 87  85 90  Resp: (!) 24  13   Temp:  97.9 F (36.6 C)    TempSrc:  Oral    SpO2: 96%  94%     Intake/Output Summary (Last 24 hours) at 08/04/2021 1101 Last data filed at 08/03/2021 2324 Gross per 24 hour  Intake 1048.79 ml  Output --  Net 1048.79 ml      06/01/2021    9:34 AM 05/17/2021   11:10 AM 01/12/2021    1:26 PM  Last 3 Weights  Weight (lbs) 224 lb 224 lb 219 lb 1.9 oz  Weight (kg) 101.606 kg 101.606 kg 99.392 kg     There is no height or weight on file to calculate BMI.  General:  Well nourished, well developed, in no acute distress HEENT: normal Neck: no JVD Vascular: No carotid bruits; Distal pulses 2+ bilaterally Cardiac:  normal S1, S2; RRR; no murmur  Lungs:  clear to auscultation bilaterally, no wheezing, rhonchi or rales  Abd: soft, nontender, no hepatomegaly  Ext: no edema Musculoskeletal:  No deformities, BUE and BLE strength normal and equal Skin: warm and dry  Neuro:  CNs 2-12 intact, no focal abnormalities noted Psych:  Normal affect   EKG:  The EKG was personally reviewed and demonstrates:  sinus tachycardia, 101 bpm, slight ST elevation in lead II, T wave inversion in lead III and aVF   Relevant CV Studies:  N/a   Laboratory Data:  High Sensitivity Troponin:   Recent Labs  Lab 08/03/21 1746 08/03/21 1930 08/04/21 0850  TROPONINIHS 7 5 5      Chemistry Recent Labs  Lab 08/03/21 1746 08/03/21 1824 08/03/21 2323 08/04/21 0850  NA 129* 129*  --  132*  K 3.9 3.9   --  3.8  CL 91* 91*  --  94*  CO2 23  --   --  24  GLUCOSE 303* 300*  --  286*  BUN 26* 26*  --  26*  CREATININE 2.49* 2.10* 2.32* 2.05*  CALCIUM 9.0  --   --  9.0  MG 1.3*  --   --  1.8  GFRNONAA 28*  --  31* 36*  ANIONGAP 15  --   --  14    Recent Labs  Lab 08/03/21 1746 08/04/21 0850  PROT 6.3* 6.3*  ALBUMIN 3.6 3.6  AST 45* 40  ALT 46* 42  ALKPHOS 30* 29*  BILITOT 1.2 1.0   Lipids No results for input(s): CHOL, TRIG, HDL, LABVLDL, LDLCALC, CHOLHDL in the last 168 hours.  Hematology Recent Labs  Lab 08/03/21 1746 08/03/21 1824 08/03/21 2323  WBC 7.0  --  5.3  RBC 3.76*  --  3.84*  HGB 11.9* 12.6* 12.2*  HCT 35.1* 37.0* 35.5*  MCV 93.4  --  92.4  MCH 31.6  --  31.8  MCHC 33.9  --  34.4  RDW 12.5  --  12.4  PLT 166  --  169   Thyroid No results for input(s): TSH, FREET4 in the last 168 hours.  BNP Recent Labs  Lab 08/03/21 1750  BNP 4.7    DDimer  Recent Labs  Lab 08/04/21 0850  DDIMER 0.37     Radiology/Studies:  DG Chest Portable 1 View  Result Date: 08/03/2021 CLINICAL DATA:  Chest pain EXAM: PORTABLE CHEST 1 VIEW COMPARISON:  August 24, 2019 FINDINGS: The heart size and mediastinal contours are within normal limits. No focal airspace consolidation. No pleural effusion. No pneumothorax. The  visualized skeletal structures are unremarkable. IMPRESSION: No acute cardiopulmonary findings. Electronically Signed   By: Maudry Mayhew M.D.   On: 08/03/2021 18:39   CT CHEST ABDOMEN PELVIS WO CONTRAST  Result Date: 08/03/2021 CLINICAL DATA:  Aortic aneurysm EXAM: CT CHEST, ABDOMEN AND PELVIS WITHOUT CONTRAST TECHNIQUE: Multidetector CT imaging of the chest, abdomen and pelvis was performed following the standard protocol without IV contrast. RADIATION DOSE REDUCTION: This exam was performed according to the departmental dose-optimization program which includes automated exposure control, adjustment of the mA and/or kV according to patient size and/or use of  iterative reconstruction technique. COMPARISON:  None. FINDINGS: CT CHEST FINDINGS Cardiovascular: No significant vascular findings. Normal heart size. No pericardial effusion. Mediastinum/Nodes: No enlarged mediastinal, hilar, or axillary lymph nodes. Thyroid gland, trachea, and esophagus demonstrate no significant findings. Lungs/Pleura: Lungs are clear. No pleural effusion or pneumothorax. Musculoskeletal: No chest wall mass or suspicious bone lesions identified. CT ABDOMEN PELVIS FINDINGS Hepatobiliary: Hepatic steatosis. No focal liver abnormality. Normal gallbladder. Pancreas: Unremarkable. No pancreatic ductal dilatation or surrounding inflammatory changes. Spleen: Normal in size without focal abnormality. Adrenals/Urinary Tract: Adrenal glands are unremarkable. Kidneys are normal, without renal calculi, focal lesion, or hydronephrosis. Bladder is unremarkable. Stomach/Bowel: Stomach is within normal limits. Appendix is absent. No evidence of bowel wall thickening, distention, or inflammatory changes. Vascular/Lymphatic: Mild calcific atherosclerosis. No aneurysm. No lymphadenopathy. Reproductive: Normal prostate. Other: No abdominal wall hernia or abnormality. No abdominopelvic ascites. Musculoskeletal: No acute or significant osseous findings. IMPRESSION: 1. No acute abnormality of the chest, abdomen or pelvis. 2. No aortic aneurysm. 3. Hepatic steatosis. Aortic Atherosclerosis (ICD10-I70.0). Electronically Signed   By: Deatra Robinson M.D.   On: 08/03/2021 21:16     Assessment and Plan:   Brandon Conrad is a 66 y.o. male with a hx of anxiety, DM2 and HTN who is being seen 08/04/2021 for the evaluation of chest pain at the request of Dr. Allena Katz.  Chest pain: Reports episode while sitting at his computer but brief in nature. He honestly felt like this was more related to anxiety. hsTn neg x2. EKG with TWI in lead III and aVF which he reports is chronic. Can consider further risk stratification given his RFs  with coronary CT but this can be done as an outpatient  Hypotension: He has been exercising and losing weight. Suspect we need to scale back on his BPs meds as he has been light-headed and dizzy.  -- all home medications held currently, would not resume HCTZ -- re-evaluate BP tomorrow  AKI: Cr 2.49>>2.32>>2.05, in the setting of hypotension and ARB/HCTZ -- receiving IVFs -- recheck BMET at am  DM: Hgb A1c 14.2 -- has only been on metformin PTA -- suggested that he see endocrinology as an outpatient for further management -- SSI while inpatient  HLD: on atorvastatin 10mg  -- check lipids in am  Hypomagnesemia: Mag 1.3 on admission -- suppl, improved to 1.8   For questions or updates, please contact [facility] Please consult [email or web address] for contact info under    Signed, Laverda Page, NP  08/04/2021 11:01 AM    ATTENDING ATTESTATION  I have seen, examined and evaluated the patient this afternoon in consultation (in the ER) along with Laverda Page, NP.  After reviewing all the available data and chart, we discussed the patients laboratory, study & physical findings as well as symptoms in detail. I agree with her findings, examination as well as impression recommendations as per our discussion.    Attending adjustments noted in italics.  Very interesting gentleman who had been doing fairly well clinically from a symptom standpoint until his presentation.  It appears that his medication regimen is somewhat confusing to him and he presented in renal failure, dehydrated and hypotensive.  He did have some chest discomfort which was almost epigastric in nature that lasted a couple hours.  He is ruled out for MI making this very unlikely to be ACS/anginal type chest pain.  Regardless, his renal function is now concern with a creatinine up above 2.  Would be reluctant to do ischemic evaluation on him right now until his renal function improves.  I agree with the plan to await  return of renal function and potentially discharge home to do outpatient evaluation.  If his renal function returns to baseline normal level, we can consider Coronary CTA over Myoview, however if his renal function does not improve all that much, would then be forced to use a Myoview.  This decision can be made in the outpatient setting.  Would simply work on aggressive diabetes management given his A1c of 14.  Also lipid management.  Would not be unreasonable to start baby aspirin on discharge for prophylactic protection.  [facility] will sign off.   Medication Recommendations: Defer adjustment of antihypertensive agents to primary team based on BP responsiveness and return of renal function.  Probably would not use combination that he was currently on.  Recommend statin, could consider aspirin Other recommendations (labs, testing, etc): Would probably need a stable set of chemistry panel prior to being seen outpatient by cardiology Follow up as an outpatient: We will work on setting up outpatient follow-up.     Bryan Lemma, M.D., M.S. Interventional Cardiologist   Pager # [phone] Phone # [phone] [address] [place], [place] [address]

## 2021-08-04 NOTE — Progress Notes (Signed)
?  Progress Note ? ? ?Patient: Brandon Conrad EYC:[id number] DOB: 07/10/1955 DOA: 08/03/2021     Hospitalization day: 0 ?DOS: the patient was seen and examined on 08/04/2021 ? ?Brief hospital course: ?Past medical history of HTN, type 2 diabetes mellitus presents with complaints of chest pain as well as hypertension. ?Found to have severe acute kidney injury. ?Currently requiring aggressive IV therapy.  Cardiology consulted ACS ruled out.  No further intervention planned. ? ?Assessment and Plan: ?* Chest pain ?Appreciate cardiology consultation. ?Serial troponins are negative. ?No intervention planned inpatient. ?Most likely this pain appears to be GI in origin. ?Will treat aggressively with PPI, Carafate. ?Lipase level is negative.  Lactic acid level negative. ?Continue aspirin. ? ?Hypotension ?Most likely dehydration in the setting of hyperglycemia. ?Exacerbated with IV fluids. ?Holding antihypertensive medications. ? ?Uncontrolled type 2 diabetes mellitus with hyperglycemia, without long-term current use of insulin (HCC) ?Hemoglobin A1c more than 14. ?Patient will most likely require insulin therapy on discharge. ?Currently on sliding scale insulin. ? ?AKI (acute kidney injury) (HCC) ?Most likely from ongoing hypotension and dehydration from hyperglycemia. ?Elicited during IV fluids ?CT abdomen pelvis was negative for any obstruction or hydronephrosis. ?If renal function does not improve will request nephro consultation. ? ?Hypertriglyceridemia ?On fenofibrate. ?Monitor. ? ?LFT elevation ?Minimally elevated. ?For now monitor. ?If worsens further we will have to hold statin.  Likely in response to hypotension. ? ?Hypomagnesemia ?Will be replacing.  Monitor. ? ?Hyponatremia ?Secondary to hyperglycemia. ?Monitor. ? ?Hyperlipidemia associated with type 2 diabetes mellitus (HCC) ?Continue statin. ? ?Essential hypertension ?Holding off on HCTZ and Diovan. ?Monitor. ? ?Obesity. ?Body mass index is 32.7 kg/m?Marland Kitchen  ?Placing the  patient at high risk for poor outcome. ? ?Subjective: No nausea or vomiting.  Continues to have some epigastric pain.  No fever no chills. ? ?Physical Exam: ?Vitals:  ? 08/04/21 1418 08/04/21 1452 08/04/21 1501 08/04/21 1522  ?BP:  (!) 127/91  120/83  ?Pulse:  86    ?Resp:  16    ?Temp: 98 ?F (36.7 ?C) 98 ?F (36.7 ?C)    ?TempSrc: Oral Oral    ?SpO2:  97%    ?Weight:   94.7 kg   ?Height:   5\' 7"  (1.702 m)   ? ?General: Appear in mild distress; no visible Abnormal Neck Mass Or lumps, Conjunctiva normal ?Cardiovascular: S1 and S2 Present, no Murmur, ?Respiratory: good respiratory effort, Bilateral Air entry present and CTA, no Crackles, no wheezes ?Abdomen: Bowel Sound present, Non tender ?Extremities: no Pedal edema ?Neurology: alert and oriented to time, place, and person ?Gait not checked due to patient safety concerns  ? ?Data Reviewed: ?I have Reviewed nursing notes, Vitals, and Lab results since pt's last encounter. Pertinent lab results CBC and BMP ?I have ordered test including CBC, BMP, urinalysis, lipase level, lactic acid level ?I have reviewed the last note from cardiology,   ? ?Family Communication: None at bedside ? ?Disposition: ?Status is: Observation ? ?Author: ? , MD ?08/04/2021 6:59 PM ? ?For on call review [email or web address]. ?

## 2021-08-04 NOTE — Assessment & Plan Note (Addendum)
Replaced. ?Monitor. ?

## 2021-08-04 NOTE — Assessment & Plan Note (Signed)
Secondary to hyperglycemia. ?Monitor. ?

## 2021-08-05 DIAGNOSIS — Z6832 Body mass index (BMI) 32.0-32.9, adult: Secondary | ICD-10-CM | POA: Diagnosis not present

## 2021-08-05 DIAGNOSIS — R7989 Other specified abnormal findings of blood chemistry: Secondary | ICD-10-CM | POA: Diagnosis present

## 2021-08-05 DIAGNOSIS — E1165 Type 2 diabetes mellitus with hyperglycemia: Secondary | ICD-10-CM | POA: Diagnosis present

## 2021-08-05 DIAGNOSIS — E1169 Type 2 diabetes mellitus with other specified complication: Secondary | ICD-10-CM | POA: Diagnosis present

## 2021-08-05 DIAGNOSIS — N179 Acute kidney failure, unspecified: Secondary | ICD-10-CM | POA: Diagnosis present

## 2021-08-05 DIAGNOSIS — E669 Obesity, unspecified: Secondary | ICD-10-CM

## 2021-08-05 DIAGNOSIS — Z7984 Long term (current) use of oral hypoglycemic drugs: Secondary | ICD-10-CM | POA: Diagnosis not present

## 2021-08-05 DIAGNOSIS — E860 Dehydration: Secondary | ICD-10-CM | POA: Diagnosis present

## 2021-08-05 DIAGNOSIS — I959 Hypotension, unspecified: Secondary | ICD-10-CM | POA: Diagnosis present

## 2021-08-05 DIAGNOSIS — Z79899 Other long term (current) drug therapy: Secondary | ICD-10-CM | POA: Diagnosis not present

## 2021-08-05 DIAGNOSIS — E785 Hyperlipidemia, unspecified: Secondary | ICD-10-CM | POA: Diagnosis present

## 2021-08-05 DIAGNOSIS — I119 Hypertensive heart disease without heart failure: Secondary | ICD-10-CM | POA: Diagnosis present

## 2021-08-05 DIAGNOSIS — E781 Pure hyperglyceridemia: Secondary | ICD-10-CM | POA: Diagnosis present

## 2021-08-05 DIAGNOSIS — R0789 Other chest pain: Secondary | ICD-10-CM | POA: Diagnosis present

## 2021-08-05 DIAGNOSIS — F17210 Nicotine dependence, cigarettes, uncomplicated: Secondary | ICD-10-CM | POA: Diagnosis present

## 2021-08-05 DIAGNOSIS — F419 Anxiety disorder, unspecified: Secondary | ICD-10-CM | POA: Diagnosis present

## 2021-08-05 DIAGNOSIS — E871 Hypo-osmolality and hyponatremia: Secondary | ICD-10-CM | POA: Diagnosis present

## 2021-08-05 DIAGNOSIS — Z881 Allergy status to other antibiotic agents status: Secondary | ICD-10-CM | POA: Diagnosis not present

## 2021-08-05 DIAGNOSIS — Z91030 Bee allergy status: Secondary | ICD-10-CM | POA: Diagnosis not present

## 2021-08-05 DIAGNOSIS — R072 Precordial pain: Secondary | ICD-10-CM | POA: Diagnosis not present

## 2021-08-05 HISTORY — DX: Obesity, unspecified: E66.9

## 2021-08-05 LAB — CBC WITH DIFFERENTIAL/PLATELET
Abs Immature Granulocytes: 0.04 10*3/uL (ref 0.00–0.07)
Basophils Absolute: 0 10*3/uL (ref 0.0–0.1)
Basophils Relative: 0 %
Eosinophils Absolute: 0.1 10*3/uL (ref 0.0–0.5)
Eosinophils Relative: 3 %
HCT: 30.6 % — ABNORMAL LOW (ref 39.0–52.0)
Hemoglobin: 10.5 g/dL — ABNORMAL LOW (ref 13.0–17.0)
Immature Granulocytes: 1 %
Lymphocytes Relative: 46 %
Lymphs Abs: 1.4 10*3/uL (ref 0.7–4.0)
MCH: 31.4 pg (ref 26.0–34.0)
MCHC: 34.3 g/dL (ref 30.0–36.0)
MCV: 91.6 fL (ref 80.0–100.0)
Monocytes Absolute: 0.4 10*3/uL (ref 0.1–1.0)
Monocytes Relative: 13 %
Neutro Abs: 1.2 10*3/uL — ABNORMAL LOW (ref 1.7–7.7)
Neutrophils Relative %: 37 %
Platelets: 149 10*3/uL — ABNORMAL LOW (ref 150–400)
RBC: 3.34 MIL/uL — ABNORMAL LOW (ref 4.22–5.81)
RDW: 12.3 % (ref 11.5–15.5)
WBC: 3.2 10*3/uL — ABNORMAL LOW (ref 4.0–10.5)
nRBC: 0 % (ref 0.0–0.2)

## 2021-08-05 LAB — GLUCOSE, CAPILLARY
Glucose-Capillary: 173 mg/dL — ABNORMAL HIGH (ref 70–99)
Glucose-Capillary: 187 mg/dL — ABNORMAL HIGH (ref 70–99)
Glucose-Capillary: 188 mg/dL — ABNORMAL HIGH (ref 70–99)
Glucose-Capillary: 230 mg/dL — ABNORMAL HIGH (ref 70–99)

## 2021-08-05 LAB — COMPREHENSIVE METABOLIC PANEL
ALT: 37 U/L (ref 0–44)
AST: 37 U/L (ref 15–41)
Albumin: 3.1 g/dL — ABNORMAL LOW (ref 3.5–5.0)
Alkaline Phosphatase: 26 U/L — ABNORMAL LOW (ref 38–126)
Anion gap: 10 (ref 5–15)
BUN: 22 mg/dL (ref 8–23)
CO2: 25 mmol/L (ref 22–32)
Calcium: 8.5 mg/dL — ABNORMAL LOW (ref 8.9–10.3)
Chloride: 97 mmol/L — ABNORMAL LOW (ref 98–111)
Creatinine, Ser: 1.59 mg/dL — ABNORMAL HIGH (ref 0.61–1.24)
GFR, Estimated: 48 mL/min — ABNORMAL LOW (ref >60)
Glucose, Bld: 202 mg/dL — ABNORMAL HIGH (ref 70–99)
Potassium: 3.8 mmol/L (ref 3.5–5.1)
Sodium: 132 mmol/L — ABNORMAL LOW (ref 135–145)
Total Bilirubin: 1.3 mg/dL — ABNORMAL HIGH (ref 0.3–1.2)
Total Protein: 5 g/dL — ABNORMAL LOW (ref 6.5–8.1)

## 2021-08-05 LAB — LDL CHOLESTEROL, DIRECT: Direct LDL: 67.9 mg/dL (ref 0–99)

## 2021-08-05 LAB — MAGNESIUM: Magnesium: 1.7 mg/dL (ref 1.7–2.4)

## 2021-08-05 LAB — LIPID PANEL
Cholesterol: 143 mg/dL (ref 0–200)
HDL: 21 mg/dL — ABNORMAL LOW (ref >40)
LDL Cholesterol: UNDETERMINED mg/dL (ref 0–99)
Total CHOL/HDL Ratio: 6.8 RATIO
Triglycerides: 456 mg/dL — ABNORMAL HIGH (ref <150)
VLDL: UNDETERMINED mg/dL (ref 0–40)

## 2021-08-05 MED ORDER — INSULIN GLARGINE-YFGN 100 UNIT/ML ~~LOC~~ SOLN
10.0000 [IU] | Freq: Every day | SUBCUTANEOUS | Status: DC
  Administered 2021-08-05: 10 [IU] via SUBCUTANEOUS
  Filled 2021-08-05 (×2): qty 0.1

## 2021-08-05 MED ORDER — ZOLPIDEM TARTRATE 5 MG PO TABS
5.0000 mg | ORAL_TABLET | Freq: Every evening | ORAL | Status: DC | PRN
  Administered 2021-08-05: 5 mg via ORAL
  Filled 2021-08-05: qty 1

## 2021-08-05 MED ORDER — FENOFIBRATE 160 MG PO TABS: 160.0000 mg | ORAL_TABLET | Freq: Every day | ORAL | Status: DC

## 2021-08-05 MED ORDER — FENOFIBRATE 54 MG PO TABS
54.0000 mg | ORAL_TABLET | Freq: Every day | ORAL | Status: DC
  Administered 2021-08-05 – 2021-08-06 (×2): 54 mg via ORAL
  Filled 2021-08-05 (×2): qty 1

## 2021-08-05 NOTE — Progress Notes (Signed)
?  Progress Note ? ? ?Patient: Brandon Conrad KDX:[id number] DOB: 04/21/1956 DOA: 08/03/2021     Hospitalization day: 0 ?DOS: the patient was seen and examined on 08/05/2021 ? ?Brief hospital course: ?Past medical history of HTN, type 2 diabetes mellitus presents with complaints of chest pain as well as hypertension. ?Found to have severe acute kidney injury. ?Currently requiring aggressive IV therapy.  Cardiology consulted ACS ruled out.  No further intervention planned. ? ?Assessment and Plan: ?* AKI (acute kidney injury) (HCC) ?Most likely from ongoing hypotension and dehydration from hyperglycemia. ?Elicited during IV fluids ?CT abdomen pelvis was negative for any obstruction or hydronephrosis. ?If renal function does not improve will request nephro consultation. ? ?Hypotension ?Most likely dehydration in the setting of hyperglycemia. ?Patient is on Diovan HCTZ outpatient which is currently on hold. ? ?Uncontrolled type 2 diabetes mellitus with hyperglycemia, without long-term current use of insulin (HCC) ?Hemoglobin A1c more than 14. ?Patient will most likely require insulin therapy on discharge. ?Currently on sliding scale insulin and Lantus.  Monitor. ?Home regimen include metformin 500 mg daily, most likely related to add SGLT2 inhibitor due to his cardiovascular risk. ? ?Chest pain, non-cardiac ?Appreciate cardiology consultation. ?Serial troponins are negative. ?No intervention planned inpatient. ?Most likely this pain appears to be GI in origin. ?Will treat aggressively with PPI, Carafate. ?Lipase level is negative.  Lactic acid level negative. ?Continue aspirin. ? ?Obesity (BMI 30-39.9) ?Body mass index is 32.7 kg/m?Marland Kitchen  ?Placing the pt at higher risk of poor outcomes. ? ?Hypertriglyceridemia ?On fenofibrate.  Resume. ? ?LFT elevation ?Minimally elevated. ?For now monitor. ?If worsens further we will have to hold statin.  Likely in response to hypotension. ? ?Hypomagnesemia ?Replaced.   Monitor. ? ?Hyponatremia ?Secondary to hyperglycemia. ?Monitor. ? ?Hyperlipidemia associated with type 2 diabetes mellitus (HCC) ?Continue statin. ? ?Essential hypertension ?Blood pressure stable without any medication. ?Holding off on HCTZ and Diovan. ?Monitor. ? ?Subjective: Continues to have abdominal discomfort.  No chest pain.  Has some nausea, no vomiting.  No fever no chills. ? ?Physical Exam: ?Vitals:  ? 08/04/21 1522 08/04/21 2017 08/05/21 0644 08/05/21 1143  ?BP: 120/83 114/83 114/82 125/89  ?Pulse:  92 84 88  ?Resp:  16 18 18   ?Temp:  98.2 ?F (36.8 ?C) 98.3 ?F (36.8 ?C) 97.9 ?F (36.6 ?C)  ?TempSrc:  Oral Oral Oral  ?SpO2:    98%  ?Weight:      ?Height:      ? ?General: Appear in mild distress; no visible Abnormal Neck Mass Or lumps, Conjunctiva normal ?Cardiovascular: S1 and S2 Present, no Murmur, ?Respiratory: good respiratory effort, Bilateral Air entry present and CTA, no Crackles, no wheezes ?Abdomen: Bowel Sound present, Non tender  ?Extremities: no Pedal edema ?Neurology: alert and oriented to time, place, and person ?Gait not checked due to patient safety concerns  ? ?Data Reviewed: ?I have Reviewed nursing notes, Vitals, and Lab results since pt's last encounter. Pertinent lab results CBC and BMP ?I have ordered test including BMP   ? ?Family Communication: None at bedside ? ?Disposition: ?Status is: Inpatient ?Remains inpatient appropriate because: Ongoing issues with hyperglycemia and acute kidney injury requiring IV hydration and close monitoring with frequent blood work. ? ?Author: ? , MD ?08/05/2021 4:54 PM ? ?For on call review [email or web address]. ?

## 2021-08-05 NOTE — Assessment & Plan Note (Signed)
Body mass index is 32.7 kg/m?Marland Kitchen  ?Placing the pt at higher risk of poor outcomes. ?

## 2021-08-06 LAB — CBC WITH DIFFERENTIAL/PLATELET
Abs Immature Granulocytes: 0.05 10*3/uL (ref 0.00–0.07)
Basophils Absolute: 0 10*3/uL (ref 0.0–0.1)
Basophils Relative: 0 %
Eosinophils Absolute: 0.1 10*3/uL (ref 0.0–0.5)
Eosinophils Relative: 2 %
HCT: 31 % — ABNORMAL LOW (ref 39.0–52.0)
Hemoglobin: 10.8 g/dL — ABNORMAL LOW (ref 13.0–17.0)
Immature Granulocytes: 1 %
Lymphocytes Relative: 38 %
Lymphs Abs: 1.5 10*3/uL (ref 0.7–4.0)
MCH: 31.7 pg (ref 26.0–34.0)
MCHC: 34.8 g/dL (ref 30.0–36.0)
MCV: 90.9 fL (ref 80.0–100.0)
Monocytes Absolute: 0.5 10*3/uL (ref 0.1–1.0)
Monocytes Relative: 12 %
Neutro Abs: 1.9 10*3/uL (ref 1.7–7.7)
Neutrophils Relative %: 47 %
Platelets: 158 10*3/uL (ref 150–400)
RBC: 3.41 MIL/uL — ABNORMAL LOW (ref 4.22–5.81)
RDW: 12.4 % (ref 11.5–15.5)
WBC: 4.1 10*3/uL (ref 4.0–10.5)
nRBC: 0 % (ref 0.0–0.2)

## 2021-08-06 LAB — COMPREHENSIVE METABOLIC PANEL
ALT: 33 U/L (ref 0–44)
AST: 31 U/L (ref 15–41)
Albumin: 3.2 g/dL — ABNORMAL LOW (ref 3.5–5.0)
Alkaline Phosphatase: 26 U/L — ABNORMAL LOW (ref 38–126)
Anion gap: 6 (ref 5–15)
BUN: 17 mg/dL (ref 8–23)
CO2: 25 mmol/L (ref 22–32)
Calcium: 8.6 mg/dL — ABNORMAL LOW (ref 8.9–10.3)
Chloride: 105 mmol/L (ref 98–111)
Creatinine, Ser: 1.3 mg/dL — ABNORMAL HIGH (ref 0.61–1.24)
GFR, Estimated: >60 mL/min (ref >60)
Glucose, Bld: 152 mg/dL — ABNORMAL HIGH (ref 70–99)
Potassium: 3.6 mmol/L (ref 3.5–5.1)
Sodium: 136 mmol/L (ref 135–145)
Total Bilirubin: 0.6 mg/dL (ref 0.3–1.2)
Total Protein: 5.6 g/dL — ABNORMAL LOW (ref 6.5–8.1)

## 2021-08-06 LAB — GLUCOSE, CAPILLARY
Glucose-Capillary: 154 mg/dL — ABNORMAL HIGH (ref 70–99)
Glucose-Capillary: 140 mg/dL — ABNORMAL HIGH (ref 70–99)

## 2021-08-06 LAB — MAGNESIUM: Magnesium: 1.6 mg/dL — ABNORMAL LOW (ref 1.7–2.4)

## 2021-08-06 MED ORDER — ASPIRIN 81 MG PO TBEC: 81.0000 mg | DELAYED_RELEASE_TABLET | Freq: Every day | ORAL | 0 refills | Status: AC

## 2021-08-06 MED ORDER — NITROGLYCERIN 0.4 MG SL SUBL: 0.4000 mg | SUBLINGUAL_TABLET | SUBLINGUAL | 0 refills | Status: DC | PRN

## 2021-08-06 MED ORDER — NOVOLOG FLEXPEN 100 UNIT/ML ~~LOC~~ SOPN: PEN_INJECTOR | SUBCUTANEOUS | 0 refills | Status: DC

## 2021-08-06 MED ORDER — BLOOD GLUCOSE MONITOR KIT: PACK | 0 refills | Status: DC

## 2021-08-06 MED ORDER — DAPAGLIFLOZIN PROPANEDIOL 5 MG PO TABS: 5.0000 mg | ORAL_TABLET | Freq: Every day | ORAL | 0 refills | Status: DC

## 2021-08-06 MED ORDER — INSULIN PEN NEEDLE 32G X 4 MM MISC: 1.0000 | Freq: Every day | 0 refills | Status: AC

## 2021-08-06 MED ORDER — FOLIC ACID 1 MG PO TABS: 1.0000 mg | ORAL_TABLET | Freq: Every day | ORAL | 0 refills | Status: DC

## 2021-08-06 MED ORDER — MAGNESIUM SULFATE 2 GM/50ML IV SOLN
2.0000 g | Freq: Once | INTRAVENOUS | Status: AC
  Administered 2021-08-06: 2 g via INTRAVENOUS
  Filled 2021-08-06: qty 50

## 2021-08-06 MED ORDER — THIAMINE HCL 100 MG PO TABS: 100.0000 mg | ORAL_TABLET | Freq: Every day | ORAL | 0 refills | Status: DC

## 2021-08-06 MED ORDER — BASAGLAR KWIKPEN 100 UNIT/ML ~~LOC~~ SOPN: 12.0000 [IU] | PEN_INJECTOR | Freq: Every day | SUBCUTANEOUS | 0 refills | Status: DC

## 2021-08-07 ENCOUNTER — Telehealth: Payer: Self-pay | Admitting: General Practice

## 2021-08-07 NOTE — Telephone Encounter (Signed)
Transition Care Management Unsuccessful Follow-up Telephone Call ? ?Date of discharge and from where:  08/06/21 from [facility] ? ?Attempts:  1st Attempt ? ?Reason for unsuccessful TCM follow-up call:  Left voice message ? ?  ?

## 2021-08-08 NOTE — Discharge Summary (Signed)
?Physician Discharge Summary ?  ?Patient: Brandon Conrad MRN: [id number] DOB: 02/14/1956  ?Admit date:     08/03/2021  ?Discharge date: 08/06/2021  ?Discharge Physician: Pranav Patel  ?PCP: Matthews, Cody, DO ? ?Recommendations at discharge: ?Follow-up with PCP in 1 week with repeat CBC and BMP. ? ?Discharge Diagnoses: ?Principal Problem: ?  AKI (acute kidney injury) (HCC) ?Active Problems: ?  Hypotension ?  Chest pain, non-cardiac ?  Uncontrolled type 2 diabetes mellitus with hyperglycemia, without long-term current use of insulin (HCC) ?  Essential hypertension ?  Hyperlipidemia associated with type 2 diabetes mellitus (HCC) ?  Hyponatremia ?  Hypomagnesemia ?  LFT elevation ?  Hypertriglyceridemia ?  Obesity (BMI 30-39.9) ? ? ?Hospital Course: ?Past medical history of HTN, type 2 diabetes mellitus presents with complaints of chest pain as well as hypertension. ?Found to have severe acute kidney injury. ?Currently requiring aggressive IV therapy.  Cardiology consulted ACS ruled out.  No further intervention planned. ? ?Assessment and Plan: ?* AKI (acute kidney injury) (HCC) ?Most likely from ongoing hypotension and dehydration from hyperglycemia. ?Treated with IV fluids. ?CT abdomen pelvis was negative for any obstruction or hydronephrosis. ?Renal function improved significantly and close to normal.  Outpatient follow-up and with repeat BMP. ? ?Hypotension ?Most likely dehydration in the setting of hyperglycemia. ?Patient is on Diovan HCTZ outpatient  ?Continue to hold HCTZ. ?Resume Diovan. ? ?Uncontrolled type 2 diabetes mellitus with hyperglycemia, without long-term current use of insulin (HCC) ?Hemoglobin A1c more than 14. ?Patient will most likely require insulin therapy on discharge. ?Currently on sliding scale insulin and Lantus.  Monitor. ?Home regimen include metformin 500 mg daily. ?Add SGLT2 inhibitor due to his cardiovascular risk. ? ?Chest pain, non-cardiac ?Appreciate cardiology consultation. ?Serial  troponins are negative. ?No intervention planned inpatient. ?Most likely this pain appears to be GI in origin. ?Will treat aggressively with PPI, Carafate. ?Lipase level is negative.  Lactic acid level negative. ?Continue aspirin. ? ?Obesity (BMI 30-39.9) ?Body mass index is 32.7 kg/m?.  ?Placing the pt at higher risk of poor outcomes. ? ?Hypertriglyceridemia ?On fenofibrate.  Resume. ? ?LFT elevation ?Minimally elevated. ?For now monitor. ? ?Hypomagnesemia ?Replaced.  Monitor. ? ?Hyponatremia ?Secondary to hyperglycemia. ?Monitor. ? ?Hyperlipidemia associated with type 2 diabetes mellitus (HCC) ?Continue statin. ? ?Essential hypertension ?Blood pressure stable without any medication. ?Holding off on HCTZ ?Resume Diovan. ?Monitor. ? ?Consultants: Cardiology  ?Procedures performed:  ?Echocardiogram  ?DISCHARGE MEDICATION: ?Allergies as of 08/06/2021   ? ?   Reactions  ? Bee Venom Anaphylaxis  ? Cephalosporins Rash  ? ?  ? ?  ?Medication List  ?  ? ?STOP taking these medications   ? ?hydrochlorothiazide 25 MG tablet ?Commonly known as: HYDRODIURIL ?  ? ?  ? ?TAKE these medications   ? ?aspirin 81 MG EC tablet ?Take 1 tablet (81 mg total) by mouth daily. ?What changed:  ?medication strength ?how much to take ?when to take this ?reasons to take this ?  ?atorvastatin 20 MG tablet ?Commonly known as: LIPITOR ?Take 0.5 tablets (10 mg total) by mouth daily. ?What changed: how much to take ?  ?Basaglar KwikPen 100 UNIT/ML ?Inject 12 Units into the skin at bedtime. ?  ?blood glucose meter kit and supplies Kit ?Dispense based on patient and insurance preference. Use up to four times daily as directed. ?Notes to patient: Check your Blood glucose before Meals and at Bedtime. Take Sliding Scale Insulin according to directions. ?  ?cyclobenzaprine 10 MG tablet ?Commonly known as: FLEXERIL ?TAKE 1/2   TO 1 TABLET BY MOUTH 3 TIMES A DAY AS NEEDED FOR MUSCLE SPASMS, MAY CAUSE DROWSINESS ?What changed: See the new instructions. ?   ?dapagliflozin propanediol 5 MG Tabs tablet ?Commonly known as: FARXIGA ?Take 1 tablet (5 mg total) by mouth daily before breakfast. ?  ?fenofibrate 160 MG tablet ?TAKE 1 TABLET BY MOUTH EVERY DAY ?  ?folic acid 1 MG tablet ?Commonly known as: FOLVITE ?Take 1 tablet (1 mg total) by mouth daily. ?  ?Insulin Pen Needle 32G X 4 MM Misc ?1 Act by Does not apply route 5 (five) times daily. ?  ?metFORMIN 500 MG 24 hr tablet ?Commonly known as: Glucophage XR ?Take 1 tablet (500 mg total) by mouth daily with breakfast. ?  ?nitroGLYCERIN 0.4 MG SL tablet ?Commonly known as: NITROSTAT ?Place 1 tablet (0.4 mg total) under the tongue every 5 (five) minutes as needed for chest pain. ?  ?NovoLOG FlexPen 100 UNIT/ML FlexPen ?Generic drug: insulin aspart ?If Blood Glucose is 121-150: take 2 units, BG 151 - 200: take 3 units, BG 201 - 250:take 5 units, BG 251 - 300:Take 8 units, BG 301 - 350: take 11 units, BG more than 351: take 15 units ?Notes to patient: Before Meals and at Bedtime ?  ?omega-3 acid ethyl esters 1 g capsule ?Commonly known as: LOVAZA ?Take 2 capsules (2 g total) by mouth 2 (two) times daily. ?  ?omeprazole 40 MG capsule ?Commonly known as: PRILOSEC ?TAKE 1 CAPSULE BY MOUTH EVERY DAY ?What changed:  ?how much to take ?how to take this ?when to take this ?  ?thiamine 100 MG tablet ?Take 1 tablet (100 mg total) by mouth daily. ?  ?valsartan 320 MG tablet ?Commonly known as: DIOVAN ?TAKE 1 TABLET BY MOUTH EVERY DAY ?Notes to patient: As Directed, As Before ?  ? ?  ? ? Follow-up Information   ? ? Matthews, Cody, DO. Schedule an appointment as soon as possible for a visit in 1 week(s).   ?Specialty: Family Medicine ?Contact information: ?[address]  ?[address] ?[place] [address] ?[phone] ? ? ?  ?  ? ?  ?  ? ?  ? ?Disposition: Home ?Diet recommendation:  ?Discharge Diet Orders (From admission, onward)  ? ?  Start     Ordered  ? 08/06/21 0000  Diet Carb Modified       ? 08/06/21 0750  ? ?  ?  ? ?   ? ?Discharge Exam: ?Filed Weights  ? 08/04/21 1501  ?Weight: 94.7 kg  ? ?General: Appear in no distress; no visible Abnormal Neck Mass Or lumps, Conjunctiva normal ?Cardiovascular: S1 and S2 Present, no Murmur, ?Respiratory: good respiratory effort, Bilateral Air entry present and CTA, no Crackles, no wheezes ?Abdomen: Bowel Sound present Non tender  ?Extremities: no Pedal edema ?Neurology: alert and oriented to time, place, and person ?Gait not checked due to patient safety concerns  ? ?Condition at discharge: good ? ?The results of significant diagnostics from this hospitalization (including imaging, microbiology, ancillary and laboratory) are listed below for reference.  ? ?Imaging Studies: ?DG Chest Portable 1 View ? ?Result Date: 08/03/2021 ?CLINICAL DATA:  Chest pain EXAM: PORTABLE CHEST 1 VIEW COMPARISON:  August 24, 2019 FINDINGS: The heart size and mediastinal contours are within normal limits. No focal airspace consolidation. No pleural effusion. No pneumothorax. The visualized skeletal structures are unremarkable. IMPRESSION: No acute cardiopulmonary findings. Electronically Signed   By: Jeffrey  Waltz M.D.   On: 08/03/2021 18:39  ? ?  CT CHEST ABDOMEN PELVIS WO CONTRAST ? ?Result Date: 08/03/2021 ?CLINICAL DATA:  Aortic aneurysm EXAM: CT CHEST, ABDOMEN AND PELVIS WITHOUT CONTRAST TECHNIQUE: Multidetector CT imaging of the chest, abdomen and pelvis was performed following the standard protocol without IV contrast. RADIATION DOSE REDUCTION: This exam was performed according to the departmental dose-optimization program which includes automated exposure control, adjustment of the mA and/or kV according to patient size and/or use of iterative reconstruction technique. COMPARISON:  None. FINDINGS: CT CHEST FINDINGS Cardiovascular: No significant vascular findings. Normal heart size. No pericardial effusion. Mediastinum/Nodes: No enlarged mediastinal, hilar, or axillary lymph nodes. Thyroid gland, trachea, and  esophagus demonstrate no significant findings. Lungs/Pleura: Lungs are clear. No pleural effusion or pneumothorax. Musculoskeletal: No chest wall mass or suspicious bone lesions identified. CT ABDOMEN PELVIS FINDINGS

## 2021-08-09 ENCOUNTER — Encounter: Payer: Self-pay | Admitting: Family Medicine

## 2021-08-09 ENCOUNTER — Ambulatory Visit: Payer: BC Managed Care – PPO | Admitting: Family Medicine

## 2021-08-09 ENCOUNTER — Other Ambulatory Visit: Payer: Self-pay

## 2021-08-09 VITALS — BP 112/78 | HR 114 | Ht 67.0 in | Wt 205.0 lb

## 2021-08-09 DIAGNOSIS — E1169 Type 2 diabetes mellitus with other specified complication: Secondary | ICD-10-CM | POA: Diagnosis not present

## 2021-08-09 DIAGNOSIS — I10 Essential (primary) hypertension: Secondary | ICD-10-CM

## 2021-08-09 DIAGNOSIS — E1165 Type 2 diabetes mellitus with hyperglycemia: Secondary | ICD-10-CM

## 2021-08-09 DIAGNOSIS — E781 Pure hyperglyceridemia: Secondary | ICD-10-CM

## 2021-08-09 DIAGNOSIS — E669 Obesity, unspecified: Secondary | ICD-10-CM

## 2021-08-09 NOTE — Assessment & Plan Note (Signed)
Would expect this to improve with better management of his diabetes and dietary change. ?

## 2021-08-09 NOTE — Progress Notes (Signed)
?Brandon Conrad - 66 y.o. male MRN [id number]  Date of birth: 07/12/1955 ? ?Subjective ?Chief Complaint  ?Patient presents with  ? Hospitalization Follow-up  ? Diabetes  ? ? ?HPI ?Brandon Conrad is a 66 year old male here today for follow-up of recent hospital visit.  He had near syncopal episode while at work.  Presented to the ED with hypotension and dehydration related to hyperglycemia.  He was given IV fluids and started on insulin for management of his diabetes.  He did have mild acute kidney injury which is likely related to hypotension and dehydration.  He does report that he continues to feel fatigued.  He does report having some diarrhea since getting out of the hospital.  He denies abdominal pain or nausea.  In addition to insulin SGLT2 inhibitor was added. ? ?ROS:  A comprehensive ROS was completed and negative except as noted per HPI ? ?Allergies  ?Allergen Reactions  ? Bee Venom Anaphylaxis  ? Cephalosporins Rash  ? ? ?Past Medical History:  ?Diagnosis Date  ? Anxiety   ? Hypertension   ? ? ?Past Surgical History:  ?Procedure Laterality Date  ? APPENDECTOMY  2011  ? ? ?Social History  ? ?Socioeconomic History  ? Marital status: Divorced  ?  Spouse name: Not on file  ? Number of children: Not on file  ? Years of education: Not on file  ? Highest education level: Not on file  ?Occupational History  ? Not on file  ?Tobacco Use  ? Smoking status: Former  ?  Packs/day: 0.25  ?  Types: Cigarettes  ?  Quit date: 02/16/2015  ?  Years since quitting: 6.4  ? Smokeless tobacco: Never  ? Tobacco comments:  ?  currently smoking 3 - 4 cigs/day  ?Substance and Sexual Activity  ? Alcohol use: Yes  ?  Alcohol/week: 12.0 standard drinks  ?  Types: 12 Standard drinks or equivalent per week  ?  Comment: 1-2 beers q day  ? Drug use: No  ? Sexual activity: Yes  ?  Partners: Female  ?Other Topics Concern  ? Not on file  ?Social History Narrative  ? Not on file  ? ?Social Determinants of Health  ? ?Financial Resource Strain: Not on file   ?Food Insecurity: Not on file  ?Transportation Needs: Not on file  ?Physical Activity: Not on file  ?Stress: Not on file  ?Social Connections: Not on file  ? ? ?History reviewed. No pertinent family history. ? ?Health Maintenance  ?Topic Date Due  ? FOOT EXAM  11/11/2021 (Originally 03/03/1966)  ? COLONOSCOPY (Pts 45-35yrs Insurance coverage will need to be confirmed)  12/06/2021 (Originally 03/04/2003)  ? OPHTHALMOLOGY EXAM  01/12/2022 (Originally 03/03/1968)  ? Zoster Vaccines- Shingrix (1 of 2) 01/12/2022 (Originally 03/03/2008)  ? INFLUENZA VACCINE  02/11/2022 (Originally 12/12/2020)  ? HEMOGLOBIN A1C  02/03/2022  ? TETANUS/TDAP  09/12/2027  ? Hepatitis C Screening  Completed  ? HIV Screening  Completed  ? HPV VACCINES  Aged Out  ? COVID-19 Vaccine  Discontinued  ? ? ? ?----------------------------------------------------------------------------------------------------------------------------------------------------------------------------------------------------------------- ?Physical Exam ?BP 112/78 (BP Location: Left Arm, Patient Position: Sitting, Cuff Size: Large)   Pulse (!) 114   Ht 5\' 7"  (1.702 m)   Wt 205 lb (93 kg)   SpO2 97%   BMI 32.11 kg/m?  ? ?Physical Exam ?Eyes:  ?   General: No scleral icterus. ?Cardiovascular:  ?   Rate and Rhythm: Normal rate and regular rhythm.  ?Pulmonary:  ?   Effort: Pulmonary effort is  normal.  ?   Breath sounds: Normal breath sounds.  ?Musculoskeletal:  ?   Cervical back: Neck supple.  ?Neurological:  ?   Mental Status: He is alert.  ?Psychiatric:     ?   Mood and Affect: Mood normal.     ?   Behavior: Behavior normal.  ? ? ?------------------------------------------------------------------------------------------------------------------------------------------------------------------------------------------------------------------- ?Assessment and Plan ? ?Essential hypertension ?Blood pressure well controlled with valsartan.  Recommend continuation of current  strength.  Updating renal function today. ? ?Uncontrolled type 2 diabetes mellitus with hyperglycemia, without long-term current use of insulin (HCC) ?Recent A1c of 14.2%.  We discussed working on dietary change and he does have an appointment with a dietitian.  We will continue insulin at current strength.  CBGs at home are better controlled.  If diarrhea continues we may need to discontinue metformin. ? ?Hypertriglyceridemia ?Would expect this to improve with better management of his diabetes and dietary change. ? ? ?No orders of the defined types were placed in this encounter. ? ? ?Return in about 4 weeks (around 09/06/2021) for T2DM. ? ? ? ?This visit occurred during the SARS-CoV-2 public health emergency.  Safety protocols were in place, including screening questions prior to the visit, additional usage of staff PPE, and extensive cleaning of exam room while observing appropriate contact time as indicated for disinfecting solutions.  ? ?

## 2021-08-09 NOTE — Assessment & Plan Note (Signed)
Blood pressure well controlled with valsartan.  Recommend continuation of current strength.  Updating renal function today. ?

## 2021-08-09 NOTE — Assessment & Plan Note (Signed)
Recent A1c of 14.2%.  We discussed working on dietary change and he does have an appointment with a dietitian.  We will continue insulin at current strength.  CBGs at home are better controlled.  If diarrhea continues we may need to discontinue metformin. ?

## 2021-08-09 NOTE — Patient Instructions (Signed)
Continue current medications for blood sugar control ?Increase fluid intake.  ?We'll be in touch with lab results.  ?

## 2021-08-09 NOTE — Telephone Encounter (Signed)
Transition Care Management Unsuccessful Follow-up Telephone Call ? ?Date of discharge and from where:  08/06/21 from [facility] ? ?Attempts:  2nd Attempt ? ?Reason for unsuccessful TCM follow-up call:  No answer/busy ? ?  ?

## 2021-08-10 LAB — CBC WITH DIFFERENTIAL/PLATELET
Absolute Monocytes: 812 cells/uL (ref 200–950)
Basophils Absolute: 31 cells/uL (ref 0–200)
Basophils Relative: 0.5 %
Eosinophils Absolute: 143 cells/uL (ref 15–500)
Eosinophils Relative: 2.3 %
HCT: 38.7 % (ref 38.5–50.0)
Hemoglobin: 12.8 g/dL — ABNORMAL LOW (ref 13.2–17.1)
Lymphs Abs: 1190 cells/uL (ref 850–3900)
MCH: 30.5 pg (ref 27.0–33.0)
MCHC: 33.1 g/dL (ref 32.0–36.0)
MCV: 92.4 fL (ref 80.0–100.0)
MPV: 12.4 fL (ref 7.5–12.5)
Monocytes Relative: 13.1 %
Neutro Abs: 4024 cells/uL (ref 1500–7800)
Neutrophils Relative %: 64.9 %
Platelets: 297 10*3/uL (ref 140–400)
RBC: 4.19 10*6/uL — ABNORMAL LOW (ref 4.20–5.80)
RDW: 12.3 % (ref 11.0–15.0)
Total Lymphocyte: 19.2 %
WBC: 6.2 10*3/uL (ref 3.8–10.8)

## 2021-08-10 LAB — COMPLETE METABOLIC PANEL WITH GFR
AG Ratio: 2.1 (calc) (ref 1.0–2.5)
ALT: 30 U/L (ref 9–46)
AST: 25 U/L (ref 10–35)
Albumin: 4.5 g/dL (ref 3.6–5.1)
Alkaline phosphatase (APISO): 45 U/L (ref 35–144)
BUN: 23 mg/dL (ref 7–25)
CO2: 20 mmol/L (ref 20–32)
Calcium: 8 mg/dL — ABNORMAL LOW (ref 8.6–10.3)
Chloride: 106 mmol/L (ref 98–110)
Creat: 1.32 mg/dL (ref 0.70–1.35)
Globulin: 2.1 g/dL (calc) (ref 1.9–3.7)
Glucose, Bld: 132 mg/dL — ABNORMAL HIGH (ref 65–99)
Potassium: 4.1 mmol/L (ref 3.5–5.3)
Sodium: 140 mmol/L (ref 135–146)
Total Bilirubin: 0.7 mg/dL (ref 0.2–1.2)
Total Protein: 6.6 g/dL (ref 6.1–8.1)
eGFR: 61 mL/min/{1.73_m2} (ref >=60)

## 2021-08-11 ENCOUNTER — Ambulatory Visit: Payer: BC Managed Care – PPO | Admitting: Nurse Practitioner

## 2021-08-11 NOTE — Progress Notes (Deleted)
? ? ?Office Visit  ?  ?Patient Name: Brandon Conrad ?Date of Encounter: 08/11/2021 ? ?Primary Care Provider:  Luetta Nutting, DO ?Primary Cardiologist:  Glenetta Hew, MD ? ?Chief Complaint  ?  ?66 year old male history of atypical chest pain,  ?hypertension, hyperlipidemia, type 2 diabetes, and anxiety who presents for post hospital follow-up related to hypertension and chest pain. ? ?Past Medical History  ?  ?Past Medical History:  ?Diagnosis Date  ? Anxiety   ? Hypertension   ? ?Past Surgical History:  ?Procedure Laterality Date  ? APPENDECTOMY  2011  ? ? ?Allergies ? ?Allergies  ?Allergen Reactions  ? Bee Venom Anaphylaxis  ? Cephalosporins Rash  ? ? ?History of Present Illness  ?  ?66 year old male history of atypical chest pain,  ?hypertension, hyperlipidemia, type 2 diabetes, and anxiety. ? ?He underwent stress test at [facility] April 2021 which showed no evidence of ischemia.  To the ED on 08/03/2021 with complaints of renal chest pain while working at his computer.  The pain radiated into his shoulder left shoulder and into his left arm.  No other associated symptoms.  EKG showed sinus tachycardia, slight ST elevation in lead II, T wave inversion in lead III and aVF.  Troponin was negative x2.  Additionally, he was hypotensive with systolic pressures in the 80s.  Report occasional lightheadedness and dizziness.  Cardiology was consulted.  He was hospitalized from 08/03/2021 to 08/06/2021.  His chest pain resolved, he thought his symptoms were most likely related to anxiety.  Low blood pressure was thought to be in the setting of recent exercise and weight loss. BP medications were held.  BMET did show AKI, likely in the setting of hypotension, he received IV fluids.  Hemoglobin A1c was greater than 14.  Magnesium was also low, and was supplemented.  Chest pain was thought to be noncardiac, however, given risk factors, coronary CTA was recommended as an outpatient following resolution of AKI.  He was  discharged home on 08/06/2021 in stable condition.  ? ?He presents today for follow-up.  Since his hospital discharge ? ?Atypical chest pain: ?Hypertension: ?Hyperlipidemia: ?AKI: ?Type 2 diabetes: ?Disposition: ? ?Home Medications  ?  ?Current Outpatient Medications  ?Medication Sig Dispense Refill  ? ACCU-CHEK GUIDE test strip USE TO TEST BOLOOD GLOCSOE LEVELS UP TO 4 TIMES A DAY    ? Accu-Chek Softclix Lancets lancets SMARTSIG:Topical 1-4 Times Daily    ? aspirin 81 MG EC tablet Take 1 tablet (81 mg total) by mouth daily. 120 tablet 0  ? atorvastatin (LIPITOR) 20 MG tablet Take 0.5 tablets (10 mg total) by mouth daily. (Patient taking differently: Take 20 mg by mouth daily.) 90 tablet 3  ? blood glucose meter kit and supplies KIT Dispense based on patient and insurance preference. Use up to four times daily as directed. 1 each 0  ? cyclobenzaprine (FLEXERIL) 10 MG tablet TAKE 1/2 TO 1 TABLET BY MOUTH 3 TIMES A DAY AS NEEDED FOR MUSCLE SPASMS, MAY CAUSE DROWSINESS (Patient taking differently: Take 10 mg by mouth 3 (three) times daily as needed for muscle spasms.) 90 tablet 3  ? dapagliflozin propanediol (FARXIGA) 5 MG TABS tablet Take 1 tablet (5 mg total) by mouth daily before breakfast. 30 tablet 0  ? fenofibrate 160 MG tablet TAKE 1 TABLET BY MOUTH EVERY DAY (Patient taking differently: Take 160 mg by mouth daily.) 90 tablet 1  ? folic acid (FOLVITE) 1 MG tablet Take 1 tablet (1 mg total) by mouth daily.  30 tablet 0  ? insulin aspart (NOVOLOG FLEXPEN) 100 UNIT/ML FlexPen If Blood Glucose is 121-150: take 2 units, BG 151 - 200: take 3 units, BG 201 - 250:take 5 units, BG 251 - 300:Take 8 units, BG 301 - 350: take 11 units, BG more than 351: take 15 units 15 mL 0  ? Insulin Glargine (BASAGLAR KWIKPEN) 100 UNIT/ML Inject 12 Units into the skin at bedtime. 15 mL 0  ? Insulin Pen Needle 32G X 4 MM MISC 1 Act by Does not apply route 5 (five) times daily. 200 each 0  ? metFORMIN (GLUCOPHAGE XR) 500 MG 24 hr tablet Take  1 tablet (500 mg total) by mouth daily with breakfast. 30 tablet 11  ? nitroGLYCERIN (NITROSTAT) 0.4 MG SL tablet Place 1 tablet (0.4 mg total) under the tongue every 5 (five) minutes as needed for chest pain. 30 tablet 0  ? omega-3 acid ethyl esters (LOVAZA) 1 g capsule Take 2 capsules (2 g total) by mouth 2 (two) times daily. 360 capsule 3  ? omeprazole (PRILOSEC) 40 MG capsule TAKE 1 CAPSULE BY MOUTH EVERY DAY (Patient taking differently: Take 40 mg by mouth daily. TAKE 1 CAPSULE BY MOUTH EVERY DAY) 90 capsule 3  ? thiamine 100 MG tablet Take 1 tablet (100 mg total) by mouth daily. 30 tablet 0  ? valsartan (DIOVAN) 320 MG tablet TAKE 1 TABLET BY MOUTH EVERY DAY (Patient taking differently: Take 320 mg by mouth daily.) 90 tablet 3  ? ?No current facility-administered medications for this visit.  ?  ? ?Review of Systems  ?  ?***.  All other systems reviewed and are otherwise negative except as noted above. ?  ? ?Physical Exam  ?  ?VS:  There were no vitals taken for this visit. , BMI There is no height or weight on file to calculate BMI. ?    ?GEN: Well nourished, well developed, in no acute distress. ?HEENT: normal. ?Neck: Supple, no JVD, carotid bruits, or masses. ?Cardiac: RRR, no murmurs, rubs, or gallops. No clubbing, cyanosis, edema.  Radials/DP/PT 2+ and equal bilaterally.  ?Respiratory:  Respirations regular and unlabored, clear to auscultation bilaterally. ?GI: Soft, nontender, nondistended, BS + x 4. ?MS: no deformity or atrophy. ?Skin: warm and dry, no rash. ?Neuro:  Strength and sensation are intact. ?Psych: Normal affect. ? ?Accessory Clinical Findings  ?  ?ECG personally reviewed by me today - *** - no acute changes. ? ?Lab Results  ?Component Value Date  ? WBC 6.2 08/09/2021  ? HGB 12.8 (L) 08/09/2021  ? HCT 38.7 08/09/2021  ? MCV 92.4 08/09/2021  ? PLT 297 08/09/2021  ? ?Lab Results  ?Component Value Date  ? CREATININE 1.32 08/09/2021  ? BUN 23 08/09/2021  ? NA 140 08/09/2021  ? K 4.1 08/09/2021  ?  CL 106 08/09/2021  ? CO2 20 08/09/2021  ? ?Lab Results  ?Component Value Date  ? ALT 30 08/09/2021  ? AST 25 08/09/2021  ? ALKPHOS 26 (L) 08/06/2021  ? BILITOT 0.7 08/09/2021  ? ?Lab Results  ?Component Value Date  ? CHOL 143 08/05/2021  ? HDL 21 (L) 08/05/2021  ? LDLCALC UNABLE TO CALCULATE IF TRIGLYCERIDE OVER 400 mg/dL 08/05/2021  ? LDLDIRECT 67.9 08/05/2021  ? TRIG 456 (H) 08/05/2021  ? CHOLHDL 6.8 08/05/2021  ?  ?Lab Results  ?Component Value Date  ? HGBA1C 14.2 (H) 08/03/2021  ? ? ?Assessment & Plan  ?  ?1.  *** ? ? ?Lenna Sciara, NP ?08/11/2021,  7:29 AM ?  ? ?

## 2021-08-11 NOTE — Telephone Encounter (Signed)
Transition Care Management Follow-up Telephone Call ?Date of discharge and from where: 08/06/21 from [facility] ?How have you been since you were released from the hospital? Patient had OV with Dr. Ashley Royalty on 08/09/21. ?Any questions or concerns? No ? ?

## 2021-08-13 ENCOUNTER — Other Ambulatory Visit: Payer: Self-pay | Admitting: Family Medicine

## 2021-08-15 ENCOUNTER — Other Ambulatory Visit: Payer: Self-pay

## 2021-08-15 ENCOUNTER — Encounter: Payer: Self-pay | Admitting: Nurse Practitioner

## 2021-08-15 DIAGNOSIS — R635 Abnormal weight gain: Secondary | ICD-10-CM

## 2021-08-15 NOTE — Progress Notes (Signed)
Testosterone

## 2021-08-16 ENCOUNTER — Encounter: Payer: BC Managed Care – PPO | Attending: Internal Medicine | Admitting: Nutrition

## 2021-08-16 DIAGNOSIS — E1165 Type 2 diabetes mellitus with hyperglycemia: Secondary | ICD-10-CM | POA: Insufficient documentation

## 2021-08-16 DIAGNOSIS — Z713 Dietary counseling and surveillance: Secondary | ICD-10-CM | POA: Diagnosis not present

## 2021-08-16 DIAGNOSIS — Z6830 Body mass index (BMI) 30.0-30.9, adult: Secondary | ICD-10-CM | POA: Diagnosis not present

## 2021-08-16 DIAGNOSIS — E1169 Type 2 diabetes mellitus with other specified complication: Secondary | ICD-10-CM

## 2021-08-21 ENCOUNTER — Telehealth: Payer: Self-pay | Admitting: Family Medicine

## 2021-08-21 NOTE — Telephone Encounter (Signed)
Patient called and stated Dr. Molli Hazard took him out of work at his last appointment on 3/29 and is need a return back to work note please advise.  ?

## 2021-08-22 ENCOUNTER — Encounter: Payer: Self-pay | Admitting: Family Medicine

## 2021-09-01 ENCOUNTER — Telehealth: Payer: Self-pay | Admitting: Family Medicine

## 2021-09-01 NOTE — Telephone Encounter (Signed)
Identical forms faxed by employer last week, completed and faxed back.  He can pick up a copy of these if needed.  ? ?Thanks!

## 2021-09-01 NOTE — Telephone Encounter (Signed)
Patient dropped off FMLA paperwork to be filled out by PCP. Billing form attached and placed in provider box. AMUCK *09/01/21 ?

## 2021-09-01 NOTE — Telephone Encounter (Signed)
I've spoken to Brandon Conrad and explained about the documentation being faxed to [employer] on 08/28/21. He will contact them directly for follow-up. Thanks ?

## 2021-09-02 NOTE — Progress Notes (Signed)
Diabetes history from patient very vague.  He reports sudden onset of severe high blood sugars without a apparent reason-X2 weeks ago.  Was at the gym and felt very weak, and light headed.  Went to ER and blood sugar was 381. ?Says he was diagnosed in October of last year, but past history shows FBS of 139  in 3/18. ?Medications: Insulin: Basaglar 12u at [facility], Novolog: per sliding scale-not based on meal size.  Reports no difficulty giving insulin and we discussed appropriate sites and need for rotation of sites and need for each insulin to cover which blood sugar readings.   ?SBGM:  Accucheck Guide. Checking 1-4X/day ac:  [id number].   ?Exercise:  Gym 2hr. 1-2X/wk.:  treadmill and resistance training   ?Works: 11AM-7PM.  Sits behind a Network engineer. ?Diet:  does not cook.  Wants quick and easy meals. ?Typical day: ?9AM: up  ?9:30:  Bfast of 3 eggs poached over 2 pieces of toast with butter, water to drink ?11:00 banana and orange ?1PM: sandwich-turkey, cheese, mayo, water to drink ?4PM: nuts for snack-1-2 handfulls ?7PM: was eating 12 inch sub, but has now switched to 6 inch.  Says has stopped all rice and potatoes.   ?9PM: crackers and cheese ?Discussion:   ?Discussed the progression of diabetes, cause of insulin resisitance and treatment options of which diet is the mainstay.   ?Discussed how his insulin works compared to someone that does not have diabetes and what each insulin dose covers with respect to meals and what he is eating. ?Discussed ways to reduce insulin resistance by weight loss, diet and exercise.   ?Discussed 3 major food groups and what foods are in each group.  Discussed need for all three groups in each meal, but not too much of any one at each meal. Meal plan given of 2200 calories with 3 meals and 1 HS snack.  Reviewed this. ?Discussed need for weight loss, and how he can do this with planned meal coverage that is lower in fat, as well as snacks that are lower in fat.-  List of appropriate afternoon and  HS snacks given, as well as meal plan that reduces fat to 15 grams per meal, and meat portions that are no more than 4 ounces at supper.   ?Discussed need for exercise consisting of  4-5X/wk, and doing 45 minutes to 1 hour. ?Stop all sweet drinks, fruit juices and cold cereal and milk.   ?Test blood sugar around one meal per day-before the meals and 2hr. After the meal-varing the meal each day.  Goals: ac: less than 120, hrPC: less than 180.   ?

## 2021-09-02 NOTE — Patient Instructions (Addendum)
Stop all sweet drinks, fruit juices and cold cereal and milk.   ?Test blood sugar around one meal per day-before the meals and 2hr. After the meal-varing the meal each day.  Goals: ac: less than 120, hrPC: less than 180. ? ? ?

## 2021-09-07 ENCOUNTER — Other Ambulatory Visit: Payer: Self-pay

## 2021-09-07 MED ORDER — DAPAGLIFLOZIN PROPANEDIOL 5 MG PO TABS
5.0000 mg | ORAL_TABLET | Freq: Every day | ORAL | 1 refills | Status: DC
Start: 2021-09-07 — End: 2022-03-07

## 2021-09-07 MED ORDER — THIAMINE HCL 100 MG PO TABS: 100.0000 mg | ORAL_TABLET | Freq: Every day | ORAL | 3 refills | Status: DC

## 2021-09-07 MED ORDER — FOLIC ACID 1 MG PO TABS: 1.0000 mg | ORAL_TABLET | Freq: Every day | ORAL | 3 refills | Status: DC

## 2021-09-08 ENCOUNTER — Other Ambulatory Visit: Payer: Self-pay | Admitting: Family Medicine

## 2021-09-14 ENCOUNTER — Ambulatory Visit (INDEPENDENT_AMBULATORY_CARE_PROVIDER_SITE_OTHER): Payer: BC Managed Care – PPO | Admitting: Family Medicine

## 2021-09-14 ENCOUNTER — Encounter: Payer: Self-pay | Admitting: Family Medicine

## 2021-09-14 DIAGNOSIS — I10 Essential (primary) hypertension: Secondary | ICD-10-CM

## 2021-09-14 DIAGNOSIS — E1169 Type 2 diabetes mellitus with other specified complication: Secondary | ICD-10-CM | POA: Diagnosis not present

## 2021-09-14 DIAGNOSIS — E785 Hyperlipidemia, unspecified: Secondary | ICD-10-CM

## 2021-09-14 DIAGNOSIS — E669 Obesity, unspecified: Secondary | ICD-10-CM

## 2021-09-14 NOTE — Assessment & Plan Note (Signed)
Blood pressure elevated today.  Encouraged to take medications regularly and check readings at home.  Recommend that he report readings back to me after approximately 2 weeks. ?

## 2021-09-14 NOTE — Assessment & Plan Note (Signed)
Continue use of atorvastatin. ?

## 2021-09-14 NOTE — Assessment & Plan Note (Signed)
Blood sugars seem better controlled based on readings at home.  Recommend continuation of current medications.  Follow-up in 2 months ?

## 2021-09-14 NOTE — Progress Notes (Signed)
?Brandon Conrad - 66 y.o. male MRN [id number]  Date of birth: 1955-10-05 ? ?Subjective ?Chief Complaint  ?Patient presents with  ? Diabetes  ? ? ?HPI ?Brandon Conrad is a 66 year old male here today for follow-up of diabetes.  He is taking medications as prescribed regularly.  He reports fasting blood sugars at home are typically in the low 100s.  He is not having any low blood sugars.  He denies side effects from medication.   ? ?Blood pressure remains elevated today.  He reports he is taking medications as directed.  He is not monitoring blood pressure at home.  He denies chest pain, shortness of breath, palpitations, headaches or vision changes. ? ?ROS:  A comprehensive ROS was completed and negative except as noted per HPI ?Elevated ?Allergies  ?Allergen Reactions  ? Bee Venom Anaphylaxis  ? Cephalosporins Rash  ? ? ?Past Medical History:  ?Diagnosis Date  ? Anxiety   ? Hypertension   ? ? ?Past Surgical History:  ?Procedure Laterality Date  ? APPENDECTOMY  2011  ? ? ?Social History  ? ?Socioeconomic History  ? Marital status: Divorced  ?  Spouse name: Not on file  ? Number of children: Not on file  ? Years of education: Not on file  ? Highest education level: Not on file  ?Occupational History  ? Not on file  ?Tobacco Use  ? Smoking status: Former  ?  Packs/day: 0.25  ?  Types: Cigarettes  ?  Quit date: 02/16/2015  ?  Years since quitting: 6.5  ? Smokeless tobacco: Never  ? Tobacco comments:  ?  currently smoking 3 - 4 cigs/day  ?Substance and Sexual Activity  ? Alcohol use: Yes  ?  Alcohol/week: 12.0 standard drinks  ?  Types: 12 Standard drinks or equivalent per week  ?  Comment: 1-2 beers q day  ? Drug use: No  ? Sexual activity: Yes  ?  Partners: Female  ?Other Topics Concern  ? Not on file  ?Social History Narrative  ? Not on file  ? ?Social Determinants of Health  ? ?Financial Resource Strain: Not on file  ?Food Insecurity: Not on file  ?Transportation Needs: Not on file  ?Physical Activity: Not on file  ?Stress: Not  on file  ?Social Connections: Not on file  ? ? ?No family history on file. ? ?Health Maintenance  ?Topic Date Due  ? FOOT EXAM  11/11/2021 (Originally 03/03/1966)  ? COLONOSCOPY (Pts 45-55yrs Insurance coverage will need to be confirmed)  12/06/2021 (Originally 03/04/2003)  ? OPHTHALMOLOGY EXAM  01/12/2022 (Originally 03/03/1968)  ? Zoster Vaccines- Shingrix (1 of 2) 01/12/2022 (Originally 03/03/2008)  ? INFLUENZA VACCINE  12/12/2021  ? HEMOGLOBIN A1C  02/03/2022  ? TETANUS/TDAP  09/12/2027  ? Hepatitis C Screening  Completed  ? HIV Screening  Completed  ? HPV VACCINES  Aged Out  ? COVID-19 Vaccine  Discontinued  ? ? ? ?----------------------------------------------------------------------------------------------------------------------------------------------------------------------------------------------------------------- ?Physical Exam ?BP (!) 153/91   Pulse 93   Ht 5\' 9"  (1.753 m)   Wt 214 lb (97.1 kg)   SpO2 97%   BMI 31.60 kg/m?  ? ?Physical Exam ?Constitutional:   ?   Appearance: Normal appearance.  ?Eyes:  ?   General: No scleral icterus. ?Cardiovascular:  ?   Rate and Rhythm: Normal rate and regular rhythm.  ?Pulmonary:  ?   Effort: Pulmonary effort is normal.  ?   Breath sounds: Normal breath sounds.  ?Musculoskeletal:  ?   Cervical back: Neck supple.  ?Neurological:  ?  General: No focal deficit present.  ?   Mental Status: He is alert.  ?Psychiatric:     ?   Mood and Affect: Mood normal.     ?   Behavior: Behavior normal.  ? ? ?------------------------------------------------------------------------------------------------------------------------------------------------------------------------------------------------------------------- ?Assessment and Plan ? ?Essential hypertension ?Blood pressure elevated today.  Encouraged to take medications regularly and check readings at home.  Recommend that he report readings back to me after approximately 2 weeks. ? ?Hyperlipidemia associated with type 2  diabetes mellitus (HCC) ?Continue use of atorvastatin. ? ?Type 2 diabetes mellitus with obesity (HCC) ?Blood sugars seem better controlled based on readings at home.  Recommend continuation of current medications.  Follow-up in 2 months ? ? ?No orders of the defined types were placed in this encounter. ? ? ?Return in about 2 months (around 11/14/2021) for T2DM. ? ? ? ?This visit occurred during the SARS-CoV-2 public health emergency.  Safety protocols were in place, including screening questions prior to the visit, additional usage of staff PPE, and extensive cleaning of exam room while observing appropriate contact time as indicated for disinfecting solutions.  ?Anything else about a year history ?

## 2021-09-15 ENCOUNTER — Other Ambulatory Visit: Payer: Self-pay | Admitting: Family Medicine

## 2021-09-15 LAB — VITAMIN D 25 HYDROXY (VIT D DEFICIENCY, FRACTURES): Vit D, 25-Hydroxy: 13 ng/mL — ABNORMAL LOW (ref 30–100)

## 2021-09-15 LAB — PTH, INTACT AND CALCIUM
Calcium: 9.5 mg/dL (ref 8.6–10.3)
PTH: 37 pg/mL (ref 16–77)

## 2021-09-15 MED ORDER — VITAMIN D (ERGOCALCIFEROL) 1.25 MG (50000 UNIT) PO CAPS: 50000.0000 [IU] | ORAL_CAPSULE | ORAL | 0 refills | Status: DC

## 2021-10-06 ENCOUNTER — Other Ambulatory Visit: Payer: Self-pay | Admitting: Family Medicine

## 2021-11-02 ENCOUNTER — Telehealth: Payer: Self-pay

## 2021-11-02 ENCOUNTER — Encounter: Payer: Self-pay | Admitting: Family Medicine

## 2021-11-02 NOTE — Telephone Encounter (Signed)
Pt lvm stating he was concerning about losing muscle mass rapidly. He has an appointment scheduled for 11/16/21. Please advise.

## 2021-11-05 ENCOUNTER — Other Ambulatory Visit: Payer: Self-pay | Admitting: Family Medicine

## 2021-11-16 ENCOUNTER — Ambulatory Visit: Payer: BC Managed Care – PPO | Admitting: Family Medicine

## 2021-11-16 ENCOUNTER — Other Ambulatory Visit: Payer: Self-pay | Admitting: Osteopathic Medicine

## 2021-11-16 ENCOUNTER — Other Ambulatory Visit: Payer: Self-pay | Admitting: Medical-Surgical

## 2021-11-16 ENCOUNTER — Encounter: Payer: Self-pay | Admitting: Family Medicine

## 2021-11-16 ENCOUNTER — Other Ambulatory Visit: Payer: Self-pay | Admitting: Family Medicine

## 2021-11-16 VITALS — BP 138/85 | HR 98 | Ht 69.0 in | Wt 217.0 lb

## 2021-11-16 DIAGNOSIS — R5383 Other fatigue: Secondary | ICD-10-CM

## 2021-11-16 DIAGNOSIS — E1165 Type 2 diabetes mellitus with hyperglycemia: Secondary | ICD-10-CM | POA: Diagnosis not present

## 2021-11-16 DIAGNOSIS — E1169 Type 2 diabetes mellitus with other specified complication: Secondary | ICD-10-CM

## 2021-11-16 DIAGNOSIS — E669 Obesity, unspecified: Secondary | ICD-10-CM | POA: Diagnosis not present

## 2021-11-16 MED ORDER — OZEMPIC (0.25 OR 0.5 MG/DOSE) 2 MG/3ML ~~LOC~~ SOPN: PEN_INJECTOR | SUBCUTANEOUS | 2 refills | Status: DC

## 2021-11-16 NOTE — Telephone Encounter (Signed)
Patient seen you today for fatigue. He is requesting refills for Atorvastatin, Omega 3, and Metformin.  The Atorvastatin was discontinued 01/12/2021 by [facility] and Metformin was last filled 12/07/2020 by Lyn Hollingshead

## 2021-11-16 NOTE — Patient Instructions (Addendum)
Stop in to have testosterone checked between 8-10 am.   Stop novolog.  Start ozempic.  Continue basaglar.  If you feel that blood sugar is getting too low reduce basaglar by 3 units.

## 2021-11-16 NOTE — Progress Notes (Signed)
Patient Brandon Conrad - 66 y.o. male MRN [id number]  Date of birth: 05/08/1956  Subjective No chief complaint on file.   HPI Brandon Conrad is a 66 year old male here today for follow-up visit.  Feels that he has lost muscle mass over the past several weeks.  Feels like he has put on weight or fat.  He is not currently exercising too much.  He does feel that his job keeps him fairly active.  Started on insulin after being seen in the hospital for syncopal episode secondary to dehydration and hyperglycemia.  His blood sugars have been much better controlled with current regimen.  A1c has gone from 14 to 5.8% today.  He is continue to work on dietary changes well.  He is interested in being able to come off of some of his medications.  ROS:  A comprehensive ROS was completed and negative except as noted per HPI  Allergies  Allergen Reactions   Bee Venom Anaphylaxis   Cephalosporins Rash    Past Medical History:  Diagnosis Date   Anxiety    Hypertension     Past Surgical History:  Procedure Laterality Date   APPENDECTOMY  2011    Social History   Socioeconomic History   Marital status: Divorced    Spouse name: Not on file   Number of children: Not on file   Years of education: Not on file   Highest education level: Not on file  Occupational History   Not on file  Tobacco Use   Smoking status: Former    Packs/day: 0.25    Types: Cigarettes    Quit date: 02/16/2015    Years since quitting: 6.7   Smokeless tobacco: Never   Tobacco comments:    currently smoking 3 - 4 cigs/day  Substance and Sexual Activity   Alcohol use: Yes    Alcohol/week: 12.0 standard drinks of alcohol    Types: 12 Standard drinks or equivalent per week    Comment: 1-2 beers q day   Drug use: No   Sexual activity: Yes    Partners: Female  Other Topics Concern   Not on file  Social History Narrative   Not on file   Social Determinants of Health   Financial Resource Strain: Not on file  Food  Insecurity: Not on file  Transportation Needs: Not on file  Physical Activity: Not on file  Stress: Not on file  Social Connections: Not on file    History reviewed. No pertinent family history.  Health Maintenance  Topic Date Due   FOOT EXAM  Never done   COLONOSCOPY (Pts 45-35yrs Insurance coverage will need to be confirmed)  12/06/2021 (Originally 03/04/2003)   OPHTHALMOLOGY EXAM  01/12/2022 (Originally 03/03/1968)   Zoster Vaccines- Shingrix (1 of 2) 01/12/2022 (Originally 03/03/2008)   INFLUENZA VACCINE  12/12/2021   HEMOGLOBIN A1C  02/03/2022   TETANUS/TDAP  09/12/2027   Hepatitis C Screening  Completed   HIV Screening  Completed   HPV VACCINES  Aged Out   COVID-19 Vaccine  Discontinued     ----------------------------------------------------------------------------------------------------------------------------------------------------------------------------------------------------------------- Physical Exam BP 138/85 (BP Location: Left Arm, Patient Position: Sitting, Cuff Size: Large)   Pulse 98   Ht 5\' 9"  (1.753 m)   Wt 217 lb (98.4 kg)   SpO2 97%   BMI 32.05 kg/m   Physical Exam Constitutional:      Appearance: Normal appearance.  Cardiovascular:     Rate and Rhythm: Normal rate and regular rhythm.  Pulmonary:     Effort:  Pulmonary effort is normal.     Breath sounds: Normal breath sounds.  Musculoskeletal:     Cervical back: Neck supple.  Neurological:     Mental Status: He is alert.     ------------------------------------------------------------------------------------------------------------------------------------------------------------------------------------------------------------------- Assessment and Plan  Uncontrolled type 2 diabetes mellitus with hyperglycemia, without long-term current use of insulin (HCC) His diabetes control has improved significantly.  I think we can probably get him off the insulin by the next visit.  We will go ahead  and discontinue NovoLog at this time.  Continue Basaglar at current strength.  I am adding Ozempic to help with glucose control and weight management.  Side effects reviewed with him.  We did discuss that if he is having any hypoglycemic episodes he can decrease his Basaglar by 3 units.   Meds ordered this encounter  Medications   Semaglutide,0.25 or 0.5MG /DOS, (OZEMPIC, 0.25 OR 0.5 MG/DOSE,) 2 MG/3ML SOPN    Sig: Start Ozempic 0.25mg  weekly x4 weeks then increase to 0.5mg  weekly    Dispense:  3 mL    Refill:  2    Return in about 3 months (around 02/16/2022) for T2DM.    This visit occurred during the SARS-CoV-2 public health emergency.  Safety protocols were in place, including screening questions prior to the visit, additional usage of staff PPE, and extensive cleaning of exam room while observing appropriate contact time as indicated for disinfecting solutions.

## 2021-11-16 NOTE — Assessment & Plan Note (Signed)
His diabetes control has improved significantly.  I think we can probably get him off the insulin by the next visit.  We will go ahead and discontinue NovoLog at this time.  Continue Basaglar at current strength.  I am adding Ozempic to help with glucose control and weight management.  Side effects reviewed with him.  We did discuss that if he is having any hypoglycemic episodes he can decrease his Basaglar by 3 units.

## 2021-11-17 LAB — POCT GLYCOSYLATED HEMOGLOBIN (HGB A1C): HbA1c, POC (controlled diabetic range): 5.8 % (ref 0.0–7.0)

## 2021-12-15 ENCOUNTER — Other Ambulatory Visit: Payer: Self-pay

## 2021-12-15 MED ORDER — BASAGLAR KWIKPEN 100 UNIT/ML ~~LOC~~ SOPN: 12.0000 [IU] | PEN_INJECTOR | Freq: Every day | SUBCUTANEOUS | 0 refills | Status: DC

## 2021-12-15 MED ORDER — NOVOLOG FLEXPEN 100 UNIT/ML ~~LOC~~ SOPN: PEN_INJECTOR | SUBCUTANEOUS | 0 refills | Status: DC

## 2022-01-03 ENCOUNTER — Other Ambulatory Visit: Payer: Self-pay | Admitting: Family Medicine

## 2022-01-31 ENCOUNTER — Other Ambulatory Visit: Payer: Self-pay | Admitting: Osteopathic Medicine

## 2022-03-04 ENCOUNTER — Other Ambulatory Visit: Payer: Self-pay | Admitting: Family Medicine

## 2022-03-07 ENCOUNTER — Other Ambulatory Visit: Payer: Self-pay | Admitting: Family Medicine

## 2022-03-07 NOTE — Telephone Encounter (Signed)
Called and scheduled patient for 11/06 at 10:50 AM for T2DM follow-up. Brandon Conrad

## 2022-03-16 DIAGNOSIS — R5383 Other fatigue: Secondary | ICD-10-CM | POA: Diagnosis not present

## 2022-03-16 DIAGNOSIS — E291 Testicular hypofunction: Secondary | ICD-10-CM | POA: Diagnosis not present

## 2022-03-17 ENCOUNTER — Other Ambulatory Visit: Payer: Self-pay | Admitting: Family Medicine

## 2022-03-17 DIAGNOSIS — H43393 Other vitreous opacities, bilateral: Secondary | ICD-10-CM | POA: Diagnosis not present

## 2022-03-17 DIAGNOSIS — E119 Type 2 diabetes mellitus without complications: Secondary | ICD-10-CM | POA: Diagnosis not present

## 2022-03-17 LAB — TESTOSTERONE TOTAL,FREE,BIO, MALES
Albumin: 4.4 g/dL (ref 3.6–5.1)
Sex Hormone Binding: 13 nmol/L — ABNORMAL LOW (ref 22–77)
Testosterone: 137 ng/dL — ABNORMAL LOW (ref 250–827)

## 2022-03-17 LAB — HM DIABETES EYE EXAM

## 2022-03-19 ENCOUNTER — Ambulatory Visit: Payer: BC Managed Care – PPO | Admitting: Family Medicine

## 2022-03-27 ENCOUNTER — Ambulatory Visit (INDEPENDENT_AMBULATORY_CARE_PROVIDER_SITE_OTHER): Payer: BC Managed Care – PPO | Admitting: Family Medicine

## 2022-03-27 ENCOUNTER — Encounter: Payer: Self-pay | Admitting: Family Medicine

## 2022-03-27 VITALS — BP 156/106 | HR 99 | Ht 69.0 in | Wt 220.1 lb

## 2022-03-27 DIAGNOSIS — E785 Hyperlipidemia, unspecified: Secondary | ICD-10-CM

## 2022-03-27 DIAGNOSIS — E1169 Type 2 diabetes mellitus with other specified complication: Secondary | ICD-10-CM | POA: Diagnosis not present

## 2022-03-27 DIAGNOSIS — I10 Essential (primary) hypertension: Secondary | ICD-10-CM

## 2022-03-27 DIAGNOSIS — R7989 Other specified abnormal findings of blood chemistry: Secondary | ICD-10-CM

## 2022-03-27 DIAGNOSIS — E669 Obesity, unspecified: Secondary | ICD-10-CM | POA: Diagnosis not present

## 2022-03-27 DIAGNOSIS — Z1211 Encounter for screening for malignant neoplasm of colon: Secondary | ICD-10-CM

## 2022-03-27 HISTORY — DX: Other specified abnormal findings of blood chemistry: R79.89

## 2022-03-27 LAB — POCT GLYCOSYLATED HEMOGLOBIN (HGB A1C): HbA1c, POC (controlled diabetic range): 6.2 % (ref 0.0–7.0)

## 2022-03-27 MED ORDER — OZEMPIC (0.25 OR 0.5 MG/DOSE) 2 MG/3ML ~~LOC~~ SOPN: PEN_INJECTOR | SUBCUTANEOUS | 0 refills | Status: DC

## 2022-03-27 MED ORDER — CHLORTHALIDONE 25 MG PO TABS: 25.0000 mg | ORAL_TABLET | Freq: Every day | ORAL | 0 refills | Status: DC

## 2022-03-27 MED ORDER — DOXYCYCLINE HYCLATE 100 MG PO TABS: 100.0000 mg | ORAL_TABLET | Freq: Two times a day (BID) | ORAL | 0 refills | Status: DC

## 2022-03-27 MED ORDER — ATORVASTATIN CALCIUM 20 MG PO TABS: 20.0000 mg | ORAL_TABLET | Freq: Every day | ORAL | 3 refills | Status: DC

## 2022-03-27 MED ORDER — SEMAGLUTIDE (1 MG/DOSE) 4 MG/3ML ~~LOC~~ SOPN: 1.0000 mg | PEN_INJECTOR | SUBCUTANEOUS | 1 refills | Status: DC

## 2022-03-27 NOTE — Assessment & Plan Note (Signed)
Diabetes remains fairly well controlled.  Restarting Ozempic and titrate to 1 mg as tolerated.  We will try to back down on his insulin requirements as we titrate his Ozempic.  Continue to work on diet and exercise changes.

## 2022-03-27 NOTE — Patient Instructions (Addendum)
Have additional labs completed for testosterone in the morning prior to 10am.  Restart Ozempic.  Take 0.25mg  x2 weeks then increase to 0.5mg  x4 weeks then increase to 1mg .   Try miralax for constipation.  Increase fiber intake and fluid intake.

## 2022-03-27 NOTE — Assessment & Plan Note (Signed)
He never picked up the atorvastatin.  Prescription resent.

## 2022-03-27 NOTE — Progress Notes (Signed)
Brandon Conrad - 66 y.o. male MRN [id number]  Date of birth: Jan 31, 1956  Subjective Chief Complaint  Patient presents with   Diabetes    HPI Brandon Conrad is a 66 year old male here today for follow-up visit.  Reports he is feeling well.  He has been out of Ozempic for a couple weeks.  He is tolerating this well when taking.  He has substituted an increase in NovoLog to keep blood sugars under better control.  He has not had any hypoglycemia.  A1c remains fairly stable, mildly increased since last visit.  He has been trying to stay quite active.  He has made improvements to his diet as well.  Testosterone levels were noted to be low previously.  He has had some fatigue as well as loss of muscle mass.  He is interested in pursuing treatment of his low testosterone.  Blood pressure is elevated today.  Previously on hydrochlorothiazide.  Additionally he is taking valsartan.  Tolerating this well.  Denies symptoms related to hypertension including chest pain, shortness of breath, or vision changes.  ROS:  A comprehensive ROS was completed and negative except as noted per HPI  Allergies  Allergen Reactions   Bee Venom Anaphylaxis   Cephalosporins Rash    Past Medical History:  Diagnosis Date   Anxiety    Hypertension     Past Surgical History:  Procedure Laterality Date   APPENDECTOMY  2011    Social History   Socioeconomic History   Marital status: Divorced    Spouse name: Not on file   Number of children: Not on file   Years of education: Not on file   Highest education level: Not on file  Occupational History   Not on file  Tobacco Use   Smoking status: Former    Packs/day: 0.25    Types: Cigarettes    Quit date: 02/16/2015    Years since quitting: 7.1   Smokeless tobacco: Never   Tobacco comments:    currently smoking 3 - 4 cigs/day  Substance and Sexual Activity   Alcohol use: Yes    Alcohol/week: 12.0 standard drinks of alcohol    Types: 12 Standard drinks or  equivalent per week    Comment: 1-2 beers q day   Drug use: No   Sexual activity: Yes    Partners: Female  Other Topics Concern   Not on file  Social History Narrative   Not on file   Social Determinants of Health   Financial Resource Strain: Not on file  Food Insecurity: Not on file  Transportation Needs: Not on file  Physical Activity: Not on file  Stress: Not on file  Social Connections: Not on file    History reviewed. No pertinent family history.  Health Maintenance  Topic Date Due   FOOT EXAM  Never done   OPHTHALMOLOGY EXAM  Never done   COLONOSCOPY (Pts 45-31yrs Insurance coverage will need to be confirmed)  Never done   Zoster Vaccines- Shingrix (1 of 2) 06/27/2022 (Originally 03/03/2008)   Diabetic kidney evaluation - Urine ACR  07/04/2022 (Originally 03/03/1976)   INFLUENZA VACCINE  08/12/2022 (Originally 12/12/2021)   Diabetic kidney evaluation - GFR measurement  08/10/2022   HEMOGLOBIN A1C  09/25/2022   TETANUS/TDAP  09/12/2027   Hepatitis C Screening  Completed   HIV Screening  Completed   HPV VACCINES  Aged Out   COVID-19 Vaccine  Discontinued     ----------------------------------------------------------------------------------------------------------------------------------------------------------------------------------------------------------------- Physical Exam BP (!) 156/106   Pulse 99  Ht 5\' 9"  (1.753 m)   Wt 220 lb 1.3 oz (99.8 kg)   SpO2 97%   BMI 32.50 kg/m   Physical Exam Constitutional:      Appearance: Normal appearance.  HENT:     Head: Normocephalic and atraumatic.  Eyes:     General: No scleral icterus. Cardiovascular:     Rate and Rhythm: Normal rate and regular rhythm.  Pulmonary:     Effort: Pulmonary effort is normal.     Breath sounds: Normal breath sounds.  Neurological:     Mental Status: He is alert.  Psychiatric:        Mood and Affect: Mood normal.        Behavior: Behavior normal.      ------------------------------------------------------------------------------------------------------------------------------------------------------------------------------------------------------------------- Assessment and Plan  Essential hypertension Blood pressure is elevated.  Continue valsartan at current strength.  Adding chlorthalidone.  Return in 2 weeks for blood pressure recheck.  Hyperlipidemia associated with type 2 diabetes mellitus (HCC) He never picked up the atorvastatin.  Prescription resent.  Type 2 diabetes mellitus with obesity (HCC) Diabetes remains fairly well controlled.  Restarting Ozempic and titrate to 1 mg as tolerated.  We will try to back down on his insulin requirements as we titrate his Ozempic.  Continue to work on diet and exercise changes.  Low testosterone Recheck testosterone prior to 10 AM and FSH/LH.   Meds ordered this encounter  Medications   Semaglutide,0.25 or 0.5MG /DOS, (OZEMPIC, 0.25 OR 0.5 MG/DOSE,) 2 MG/3ML SOPN    Sig: Inject 0.5mg  weekly x4 weeks then increase to 1mg     Dispense:  3 mL    Refill:  0   Semaglutide, 1 MG/DOSE, 4 MG/3ML SOPN    Sig: Inject 1 mg into the skin once a week.    Dispense:  3 mL    Refill:  1   DISCONTD: doxycycline (VIBRA-TABS) 100 MG tablet    Sig: Take 1 tablet (100 mg total) by mouth 2 (two) times daily.    Dispense:  20 tablet    Refill:  0   chlorthalidone (HYGROTON) 25 MG tablet    Sig: Take 1 tablet (25 mg total) by mouth daily.    Dispense:  90 tablet    Refill:  0   atorvastatin (LIPITOR) 20 MG tablet    Sig: Take 1 tablet (20 mg total) by mouth daily.    Dispense:  90 tablet    Refill:  3    Return in about 6 months (around 09/25/2022) for HTN/T2DM.    This visit occurred during the SARS-CoV-2 public health emergency.  Safety protocols were in place, including screening questions prior to the visit, additional usage of staff PPE, and extensive cleaning of exam room while observing  appropriate contact time as indicated for disinfecting solutions.

## 2022-03-27 NOTE — Assessment & Plan Note (Signed)
Blood pressure is elevated.  Continue valsartan at current strength.  Adding chlorthalidone.  Return in 2 weeks for blood pressure recheck.

## 2022-03-27 NOTE — Assessment & Plan Note (Signed)
Recheck testosterone prior to 10 AM and FSH/LH.

## 2022-04-02 ENCOUNTER — Other Ambulatory Visit: Payer: Self-pay | Admitting: Family Medicine

## 2022-04-19 ENCOUNTER — Encounter: Payer: Self-pay | Admitting: Family Medicine

## 2022-05-04 ENCOUNTER — Other Ambulatory Visit: Payer: Self-pay | Admitting: Family Medicine

## 2022-05-08 ENCOUNTER — Other Ambulatory Visit: Payer: Self-pay | Admitting: Family Medicine

## 2022-05-09 ENCOUNTER — Other Ambulatory Visit: Payer: Self-pay | Admitting: Family Medicine

## 2022-06-08 ENCOUNTER — Encounter: Payer: Self-pay | Admitting: Family Medicine

## 2022-06-08 ENCOUNTER — Ambulatory Visit: Payer: BC Managed Care – PPO | Admitting: Family Medicine

## 2022-06-08 VITALS — BP 120/78 | HR 105 | Temp 98.9°F | Ht 69.0 in | Wt 215.5 lb

## 2022-06-08 DIAGNOSIS — H65191 Other acute nonsuppurative otitis media, right ear: Secondary | ICD-10-CM

## 2022-06-08 DIAGNOSIS — H6690 Otitis media, unspecified, unspecified ear: Secondary | ICD-10-CM

## 2022-06-08 HISTORY — DX: Otitis media, unspecified, unspecified ear: H66.90

## 2022-06-08 MED ORDER — DOXYCYCLINE HYCLATE 100 MG PO TABS: 100.0000 mg | ORAL_TABLET | Freq: Two times a day (BID) | ORAL | 0 refills | Status: DC

## 2022-06-08 NOTE — Progress Notes (Signed)
Acute Office Visit  Subjective:     Patient ID: Brandon Conrad, male    DOB: January 09, 1956, 67 y.o.   MRN: [id number]  Chief Complaint  Patient presents with   Facial Swelling    Right side. Started after Christmas. He has taken Advil and a round of ABX. No teeth or gum swelling. He denies fever.    Nasal Congestion    HPI Patient is in today for right side facial swelling with pain.  Prescribed Doxycycline for this problem in November per PCP,  did not complete course at that time once symptoms improved.  Finished antibiotic over past 4-5 days without resolution in symptoms. Symptoms include right side facial swelling (not present in office today), right ear "clogged and uncomfortable feeling" throat feels scratchy on right side only, runny nose, post nasal drip.  Denies fever and chills, no chest pain, no shortness of breath.   Review of Systems  Constitutional:  Negative for chills and fever.  HENT:  Positive for congestion, ear pain (right), sinus pain and sore throat.   Respiratory:  Negative for shortness of breath.   Cardiovascular:  Negative for chest pain.  Gastrointestinal:  Negative for abdominal pain, nausea and vomiting.  Neurological:  Negative for headaches.        Objective:    BP 120/78   Pulse (!) 105   Temp 98.9 F (37.2 C) (Oral)   Ht 5\' 9"  (1.753 m)   Wt 215 lb 8 oz (97.8 kg)   SpO2 97%   BMI 31.82 kg/m    Physical Exam Vitals and nursing note reviewed.  Constitutional:      General: He is not in acute distress.    Appearance: Normal appearance.  HENT:     Head:     Comments: No facial swelling noted. Symmetrical.     Right Ear: Tympanic membrane is erythematous.     Left Ear: Tympanic membrane normal.     Nose:     Right Turbinates: Swollen.     Left Turbinates: Swollen.     Right Sinus: No maxillary sinus tenderness or frontal sinus tenderness.     Left Sinus: No maxillary sinus tenderness or frontal sinus tenderness.     Mouth/Throat:      Pharynx: Uvula midline. No pharyngeal swelling, oropharyngeal exudate, posterior oropharyngeal erythema or uvula swelling.  Cardiovascular:     Rate and Rhythm: Normal rate and regular rhythm.  Pulmonary:     Effort: Pulmonary effort is normal.     Breath sounds: Normal breath sounds.  Skin:    General: Skin is warm and dry.  Neurological:     General: No focal deficit present.     Mental Status: He is alert. Mental status is at baseline.  Psychiatric:        Mood and Affect: Mood normal.        Behavior: Behavior normal.        Thought Content: Thought content normal.        Judgment: Judgment normal.     No results found for any visits on 06/08/22.      Assessment & Plan:   Problem List Items Addressed This Visit     Otitis media - Primary    Right TM erythematous with discomfort. Symptoms have been present since November. He started an antibiotic at that time and did not complete the course. Doxycycline 100 mg BID x 10 days sent today. He is instructed to complete this course. Follow-up with  PCP if symptoms do not resolve.       Relevant Medications   doxycycline (VIBRA-TABS) 100 MG tablet    Meds ordered this encounter  Medications   doxycycline (VIBRA-TABS) 100 MG tablet    Sig: Take 1 tablet (100 mg total) by mouth 2 (two) times daily.    Dispense:  20 tablet    Refill:  0    Order Specific Question:   Supervising Provider    Answer:   Leeanne Rio [[id number]]  Agrees with plan of care discussed.  Questions answered.   Return if symptoms worsen or fail to improve.  Chalmers Guest, FNP

## 2022-06-08 NOTE — Assessment & Plan Note (Addendum)
Right TM erythematous with discomfort. Symptoms have been present since November. He started an antibiotic at that time and did not complete the course. Doxycycline 100 mg BID x 10 days sent today. He is instructed to complete this course. Follow-up with PCP if symptoms do not resolve.

## 2022-06-08 NOTE — Patient Instructions (Signed)
You were prescribed an antibiotic today. Please complete the full course.  Acetaminophen or ibuprofen for fever according to package instructions, do not exceed recommended doses.  Adequate fluids to avoid dehydration. Lots of rest while you are recovering.  Follow-up if your symptoms do not improve.     

## 2022-06-18 ENCOUNTER — Other Ambulatory Visit: Payer: Self-pay | Admitting: Family Medicine

## 2022-07-26 ENCOUNTER — Other Ambulatory Visit: Payer: Self-pay | Admitting: Family Medicine

## 2022-08-27 ENCOUNTER — Other Ambulatory Visit: Payer: Self-pay | Admitting: Family Medicine

## 2022-08-27 DIAGNOSIS — Z1329 Encounter for screening for other suspected endocrine disorder: Secondary | ICD-10-CM

## 2022-08-30 ENCOUNTER — Other Ambulatory Visit: Payer: Self-pay | Admitting: Family Medicine

## 2022-09-29 ENCOUNTER — Other Ambulatory Visit: Payer: Self-pay | Admitting: Family Medicine

## 2022-09-29 DIAGNOSIS — I10 Essential (primary) hypertension: Secondary | ICD-10-CM

## 2022-09-29 DIAGNOSIS — R252 Cramp and spasm: Secondary | ICD-10-CM

## 2022-10-01 NOTE — Telephone Encounter (Signed)
Pls contact the patient to schedule 6 month HTN follow-up. Due 09/26/22. Sending 30 day med refill for HTN

## 2022-10-01 NOTE — Telephone Encounter (Signed)
Called patient to schedule 6 month HTN follow up, LVM for patient to cal office to schedule, thanks.

## 2022-10-06 IMAGING — CT CT CHEST-ABD-PELV W/O CM
2 of 5 series · 15 of 46 positions shown, 17 images · non-contrast
Comparison: None.

CLINICAL DATA: Aortic aneurysm



[Series 5: cap w/o 3.0 mm st cor · coronal · non-contrast · 0.82mm/px · 3 of 115 slices shown]
[im 39/115  soft-tissue]
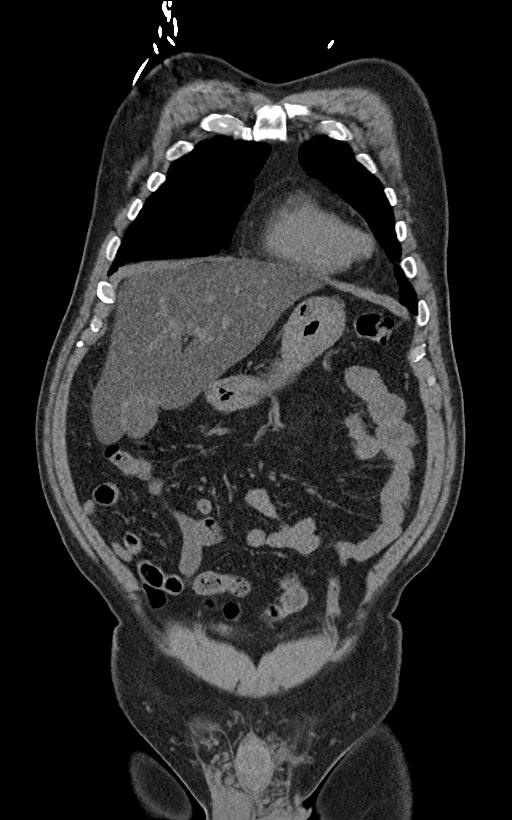
[im 51/115  soft-tissue]
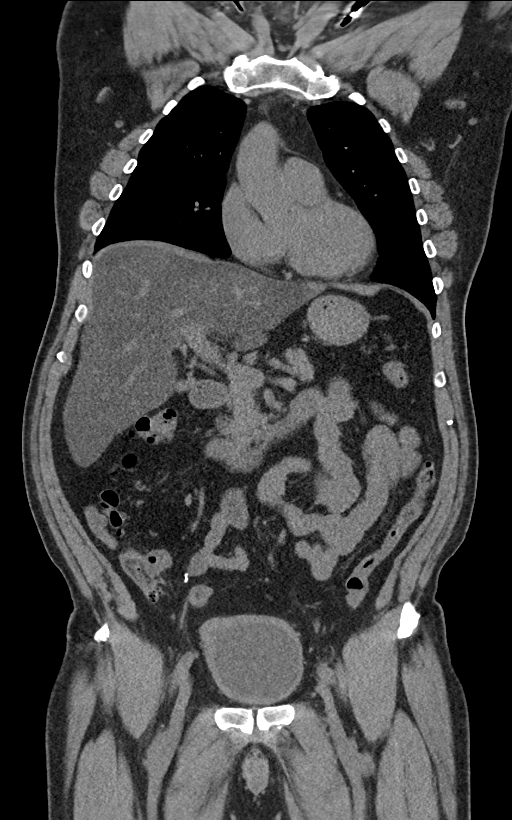
[im 64/115  soft-tissue]
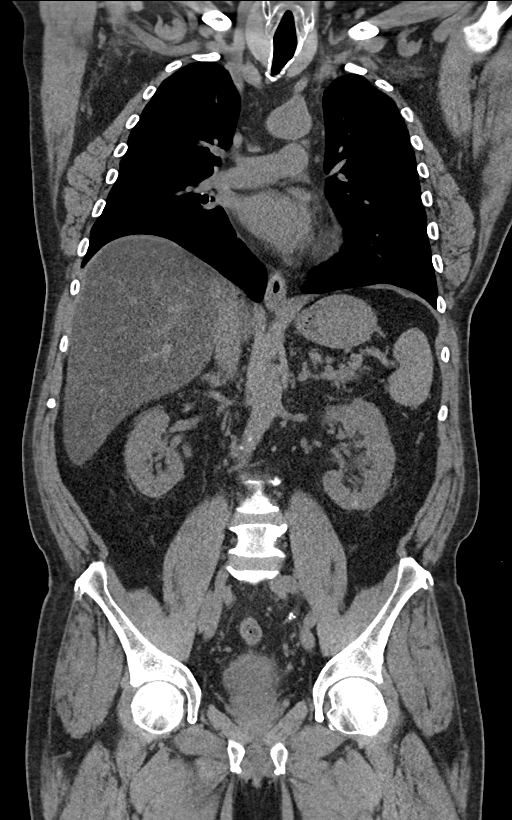

[Series 7: cap w/o 2.0 mm st · axial · non-contrast · 0.94mm/px · z∈[+644,+1232]mm · 12 of 336 slices shown, 14 images]
[im 21/336  soft-tissue]
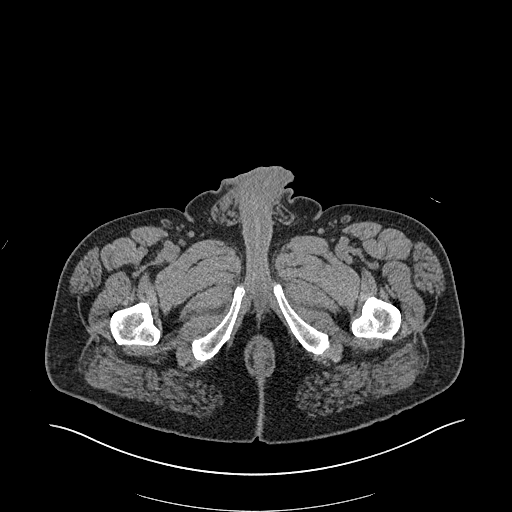
[im 21/336  bone]
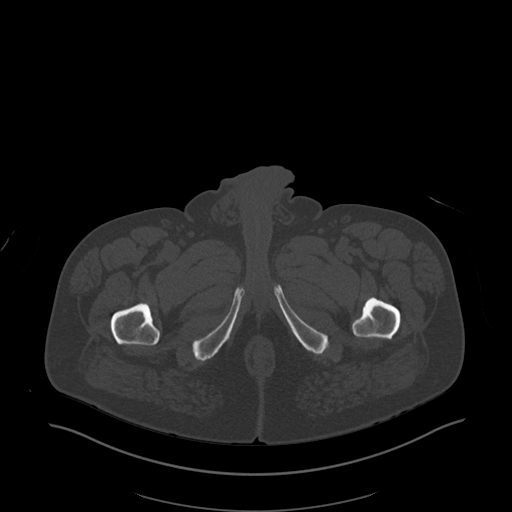
[im 42/336  soft-tissue]
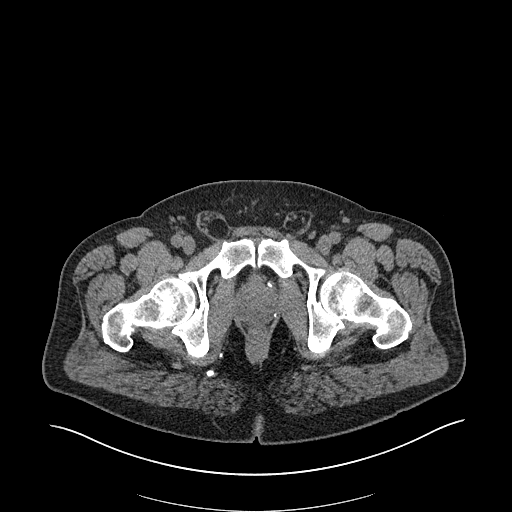
[im 84/336  soft-tissue]
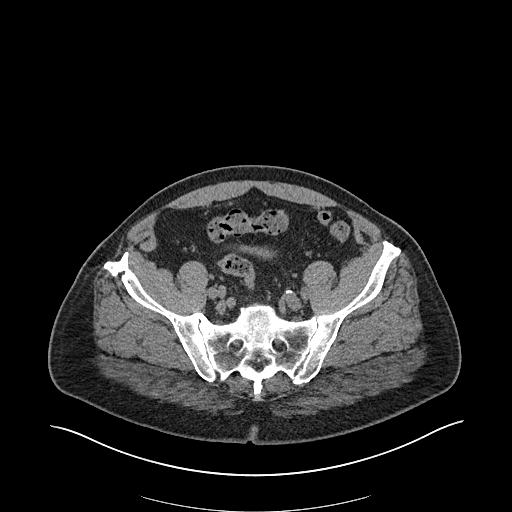
[im 105/336  soft-tissue]
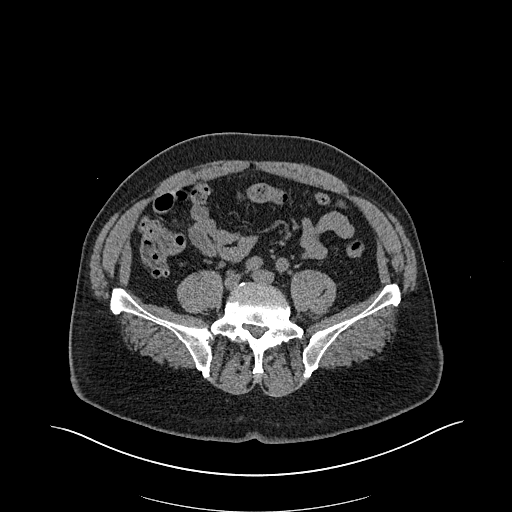
[im 126/336  soft-tissue]
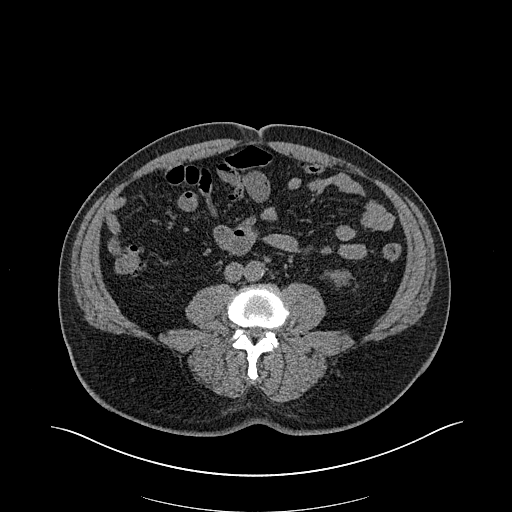
[im 147/336  soft-tissue]
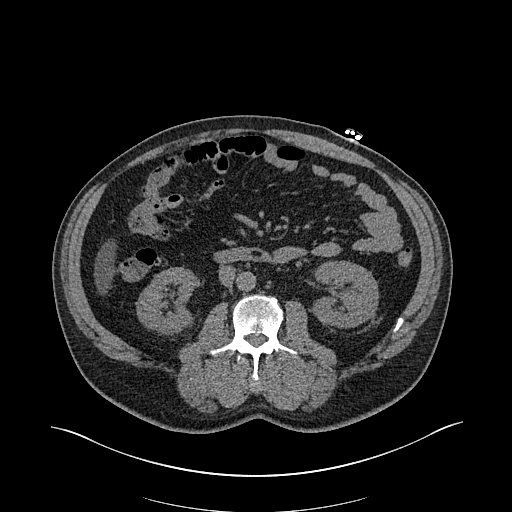
[im 189/336  soft-tissue]
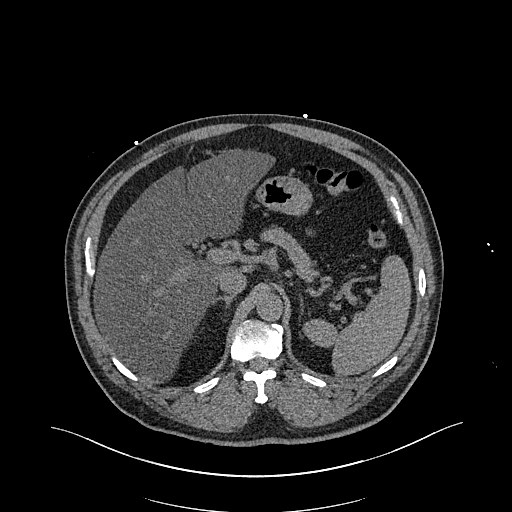
[im 210/336  soft-tissue]
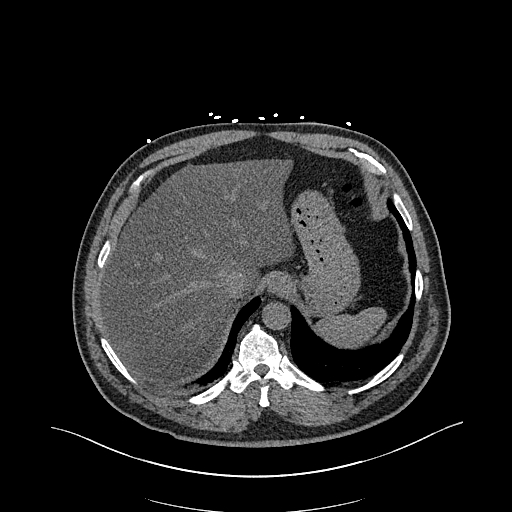
[im 231/336  soft-tissue]
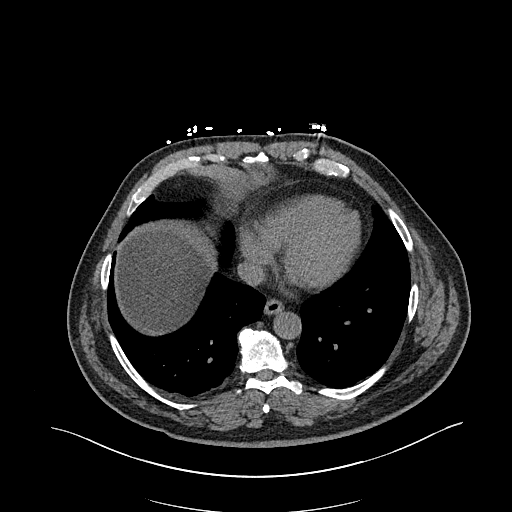
[im 231/336  bone]
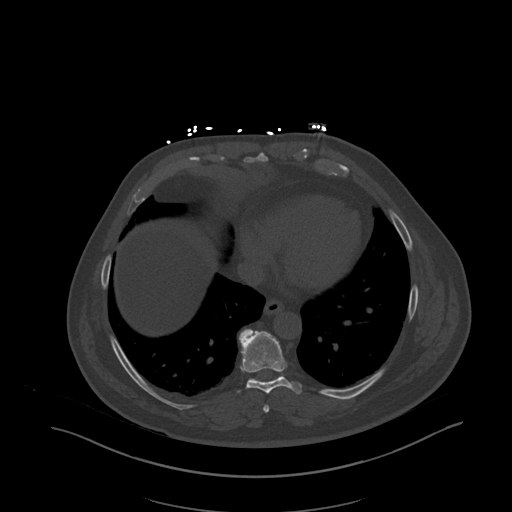
[im 252/336  soft-tissue]
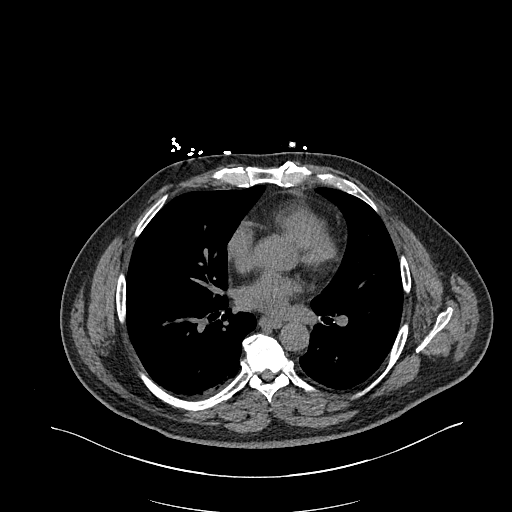
[im 294/336  soft-tissue]
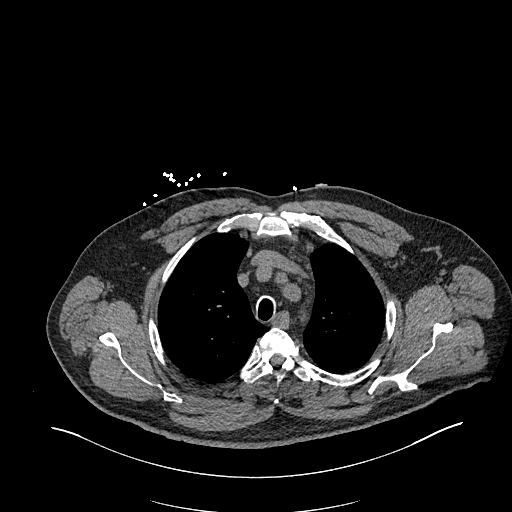
[im 315/336  soft-tissue]
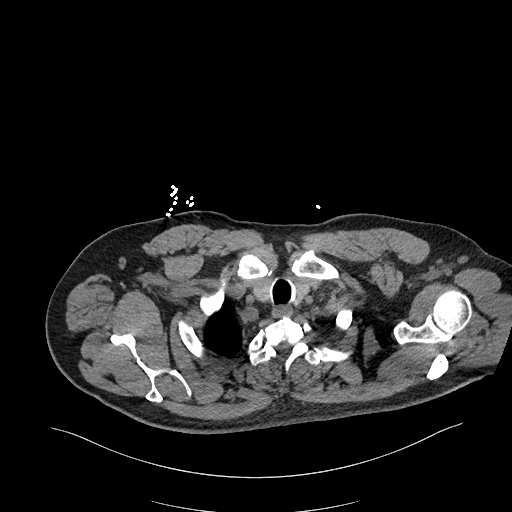

[15 of 46 positions shown; findings below may reference images not displayed]

FINDINGS: CT CHEST FINDINGS

Cardiovascular: No significant vascular findings. Normal heart size.
No pericardial effusion.

Mediastinum/Nodes: No enlarged mediastinal, hilar, or axillary lymph
nodes. Thyroid gland, trachea, and esophagus demonstrate no
significant findings.

Lungs/Pleura: Lungs are clear. No pleural effusion or pneumothorax.

Musculoskeletal: No chest wall mass or suspicious bone lesions
identified.

CT ABDOMEN PELVIS FINDINGS

Hepatobiliary: Hepatic steatosis. No focal liver abnormality. Normal
gallbladder.

Pancreas: Unremarkable. No pancreatic ductal dilatation or
surrounding inflammatory changes.

Spleen: Normal in size without focal abnormality.

Adrenals/Urinary Tract: Adrenal glands are unremarkable. Kidneys are
normal, without renal calculi, focal lesion, or hydronephrosis.
Bladder is unremarkable.

Stomach/Bowel: Stomach is within normal limits. Appendix is absent.
No evidence of bowel wall thickening, distention, or inflammatory
changes.

Vascular/Lymphatic: Mild calcific atherosclerosis. No aneurysm. No
lymphadenopathy.

Reproductive: Normal prostate.

Other: No abdominal wall hernia or abnormality. No abdominopelvic
ascites.

Musculoskeletal: No acute or significant osseous findings.
IMPRESSION: 1. No acute abnormality of the chest, abdomen or pelvis.
2. No aortic aneurysm.
3. Hepatic steatosis.

Aortic Atherosclerosis (3MVE0-1YM.M).

## 2022-10-17 ENCOUNTER — Other Ambulatory Visit: Payer: Self-pay | Admitting: Family Medicine

## 2022-10-22 ENCOUNTER — Other Ambulatory Visit: Payer: Self-pay | Admitting: Family Medicine

## 2022-10-22 DIAGNOSIS — E1165 Type 2 diabetes mellitus with hyperglycemia: Secondary | ICD-10-CM

## 2022-10-22 DIAGNOSIS — I10 Essential (primary) hypertension: Secondary | ICD-10-CM

## 2022-10-23 NOTE — Telephone Encounter (Signed)
Patient scheduled for tomorrow 10/24/2022 for HTN/DM follow-up, thanks.

## 2022-10-23 NOTE — Telephone Encounter (Signed)
Pls contact patient to schedule HTN/DM follow-up. Past due since May 2024. Sending 30 day refill. Thanks

## 2022-10-24 ENCOUNTER — Ambulatory Visit (INDEPENDENT_AMBULATORY_CARE_PROVIDER_SITE_OTHER): Payer: BC Managed Care – PPO | Admitting: Family Medicine

## 2022-10-24 VITALS — BP 119/75 | HR 106 | Ht 69.0 in | Wt 220.0 lb

## 2022-10-24 DIAGNOSIS — H6090 Unspecified otitis externa, unspecified ear: Secondary | ICD-10-CM

## 2022-10-24 DIAGNOSIS — E785 Hyperlipidemia, unspecified: Secondary | ICD-10-CM

## 2022-10-24 DIAGNOSIS — I10 Essential (primary) hypertension: Secondary | ICD-10-CM | POA: Diagnosis not present

## 2022-10-24 DIAGNOSIS — E1169 Type 2 diabetes mellitus with other specified complication: Secondary | ICD-10-CM

## 2022-10-24 DIAGNOSIS — K429 Umbilical hernia without obstruction or gangrene: Secondary | ICD-10-CM

## 2022-10-24 DIAGNOSIS — H60391 Other infective otitis externa, right ear: Secondary | ICD-10-CM

## 2022-10-24 DIAGNOSIS — E669 Obesity, unspecified: Secondary | ICD-10-CM | POA: Diagnosis not present

## 2022-10-24 HISTORY — DX: Unspecified otitis externa, unspecified ear: H60.90

## 2022-10-24 HISTORY — DX: Umbilical hernia without obstruction or gangrene: K42.9

## 2022-10-24 LAB — POCT GLYCOSYLATED HEMOGLOBIN (HGB A1C): HbA1c, POC (controlled diabetic range): 7.3 % — AB (ref 0.0–7.0)

## 2022-10-24 MED ORDER — OFLOXACIN 0.3 % OT SOLN: 10.0000 [drp] | Freq: Two times a day (BID) | OTIC | 0 refills | Status: DC

## 2022-10-24 NOTE — Assessment & Plan Note (Signed)
He has swelling of the right ear canal with some purulent fluid.  Unable to visualize TM.  Question possible TM rupture as he does have difficulty hearing on that side.  Treated with ofloxacin drops.  He will let me know if not improving.

## 2022-10-24 NOTE — Progress Notes (Signed)
Brandon Conrad - 67 y.o. male MRN [id number]  Date of birth: 04-23-1956  Subjective Chief Complaint  Patient presents with   Diabetes    HPI Brandon Conrad is a 67 y.o. male here today for follow up visit.   He has complaint of R ear pain.  He has had this in the past and feels similar to previous infection.  Ear canal feels swollen.  He does have some drainage from the ear.  Noticed possible hernia around the umbilicus. This area is tender to touch at times.  Was easily reducible previously but now feels that he has little more difficulty at times.  Bowels are moving normally.  He has noticed some cramping and spasm in his lower extremities.  He does have history of hypomagnesemia.  He is not taking Ozempic at this time.  He continues on metformin and novolog.  No longer traking Ozempic or basaglar.  He has significant diarrhea with metformin.  His last blood sugar was well controlled but that was over 6 months ago.    He is due for colon cancer screening.  He plans to schedule this.    ROS:  A comprehensive ROS was completed and negative except as noted per HPI  Allergies  Allergen Reactions   Bee Venom Anaphylaxis   Cephalosporins Rash    Past Medical History:  Diagnosis Date   Anxiety    Hypertension     Past Surgical History:  Procedure Laterality Date   APPENDECTOMY  2011    Social History   Socioeconomic History   Marital status: Divorced    Spouse name: Not on file   Number of children: Not on file   Years of education: Not on file   Highest education level: Some college, no degree  Occupational History   Not on file  Tobacco Use   Smoking status: Former    Packs/day: .25    Types: Cigarettes    Quit date: 02/16/2015    Years since quitting: 7.6   Smokeless tobacco: Never   Tobacco comments:    currently smoking 3 - 4 cigs/day  Substance and Sexual Activity   Alcohol use: Yes    Alcohol/week: 12.0 standard drinks of alcohol    Types: 12 Standard drinks  or equivalent per week    Comment: 1-2 beers q day   Drug use: No   Sexual activity: Yes    Partners: Female  Other Topics Concern   Not on file  Social History Narrative   Not on file   Social Determinants of Health   Financial Resource Strain: High Risk (10/24/2022)   Overall Financial Resource Strain (CARDIA)    Difficulty of Paying Living Expenses: Hard  Food Insecurity: No Food Insecurity (10/24/2022)   Hunger Vital Sign    Worried About Running Out of Food in the Last Year: Never true    Ran Out of Food in the Last Year: Never true  Transportation Needs: No Transportation Needs (10/24/2022)   PRAPARE - Administrator, Civil Service (Medical): No    Lack of Transportation (Non-Medical): No  Physical Activity: Insufficiently Active (10/24/2022)   Exercise Vital Sign    Days of Exercise per Week: 2 days    Minutes of Exercise per Session: 60 min  Stress: No Stress Concern Present (10/24/2022)   [employer] of Occupational Health - Occupational Stress Questionnaire    Feeling of Stress : Only a little  Social Connections: Moderately Isolated (10/24/2022)   Social Connection  and Isolation Panel [NHANES]    Frequency of Communication with Friends and Family: More than three times a week    Frequency of Social Gatherings with Friends and Family: More than three times a week    Attends Religious Services: 1 to 4 times per year    Active Member of [employer] or Organizations: No    Attends Engineer, structural: Not on file    Marital Status: Divorced    History reviewed. No pertinent family history.  Health Maintenance  Topic Date Due   Diabetic kidney evaluation - Urine ACR  Never done   Colonoscopy  Never done   Zoster Vaccines- Shingrix (1 of 2) Never done   Diabetic kidney evaluation - eGFR measurement  08/10/2022   INFLUENZA VACCINE  12/13/2022   OPHTHALMOLOGY EXAM  03/18/2023   HEMOGLOBIN A1C  04/25/2023   FOOT EXAM  10/24/2023   DTaP/Tdap/Td (2 -  Td or Tdap) 09/12/2027   Hepatitis C Screening  Completed   HIV Screening  Completed   HPV VACCINES  Aged Out   COVID-19 Vaccine  Discontinued     ----------------------------------------------------------------------------------------------------------------------------------------------------------------------------------------------------------------- Physical Exam BP 119/75 (BP Location: Left Arm, Patient Position: Sitting, Cuff Size: Normal)   Pulse (!) 106   Ht 5\' 9"  (1.753 m)   Wt 220 lb (99.8 kg)   SpO2 96%   BMI 32.49 kg/m   Physical Exam Constitutional:      Appearance: Normal appearance.  HENT:     Head: Normocephalic and atraumatic.     Ears:     Comments: Swelling and purulent fluid in the EAC on the right ear.  Unable to visualize TM. Eyes:     General: No scleral icterus. Cardiovascular:     Rate and Rhythm: Normal rate and regular rhythm.  Pulmonary:     Effort: Pulmonary effort is normal.     Breath sounds: Normal breath sounds.  Abdominal:     Comments: Small umbilical hernia noted.  Reducible when laying down.  No significant tenderness at this time.  Musculoskeletal:     Cervical back: Neck supple.  Neurological:     Mental Status: He is alert.  Psychiatric:        Mood and Affect: Mood normal.        Behavior: Behavior normal.     ------------------------------------------------------------------------------------------------------------------------------------------------------------------------------------------------------------------- Assessment and Plan  Type 2 diabetes mellitus with obesity (HCC) Control of diabetes has worsened some since last visit.  Encouraged to work on dietary changes.  Will continue metformin and Humalog at this time.  Hypomagnesemia Has had some increased cramping.  Checking electrolytes including magnesium today.  Otitis externa He has swelling of the right ear canal with some purulent fluid.  Unable to  visualize TM.  Question possible TM rupture as he does have difficulty hearing on that side.  Treated with ofloxacin drops.  He will let me know if not improving.  Umbilical hernia without obstruction and without gangrene Small hernia.  Some pain at times.  Referral placed to general surgery.   Meds ordered this encounter  Medications   ofloxacin (FLOXIN) 0.3 % OTIC solution    Sig: Place 10 drops into the right ear 2 (two) times daily for 14 days.    Dispense:  5 mL    Refill:  0    Return in about 4 months (around 02/23/2023) for T2DM.    This visit occurred during the SARS-CoV-2 public health emergency.  Safety protocols were in place, including screening questions prior  to the visit, additional usage of staff PPE, and extensive cleaning of exam room while observing appropriate contact time as indicated for disinfecting solutions.

## 2022-10-24 NOTE — Assessment & Plan Note (Signed)
Control of diabetes has worsened some since last visit.  Encouraged to work on dietary changes.  Will continue metformin and Humalog at this time.

## 2022-10-24 NOTE — Assessment & Plan Note (Signed)
Has had some increased cramping.  Checking electrolytes including magnesium today.

## 2022-10-24 NOTE — Assessment & Plan Note (Signed)
Small hernia.  Some pain at times.  Referral placed to general surgery.

## 2022-10-24 NOTE — Patient Instructions (Addendum)
A1c is up some at 7.3%-Continue to work on dietary changes.  You can try sildenafil as needed for ED.  Do not use nitroglycerin if you have taken this medication in the past 24 hours.  Schedule colonoscopy.

## 2022-10-25 LAB — CBC WITH DIFFERENTIAL/PLATELET
Absolute Monocytes: 810 cells/uL (ref 200–950)
Basophils Absolute: 64 cells/uL (ref 0–200)
Basophils Relative: 0.7 %
Eosinophils Absolute: 322 cells/uL (ref 15–500)
Eosinophils Relative: 3.5 %
HCT: 42.8 % (ref 38.5–50.0)
Hemoglobin: 14.2 g/dL (ref 13.2–17.1)
Lymphs Abs: 2429 cells/uL (ref 850–3900)
MCH: 31.6 pg (ref 27.0–33.0)
MCHC: 33.2 g/dL (ref 32.0–36.0)
MCV: 95.3 fL (ref 80.0–100.0)
MPV: 12 fL (ref 7.5–12.5)
Monocytes Relative: 8.8 %
Neutro Abs: 5575 cells/uL (ref 1500–7800)
Neutrophils Relative %: 60.6 %
Platelets: 279 10*3/uL (ref 140–400)
RBC: 4.49 10*6/uL (ref 4.20–5.80)
RDW: 12.1 % (ref 11.0–15.0)
Total Lymphocyte: 26.4 %
WBC: 9.2 10*3/uL (ref 3.8–10.8)

## 2022-10-25 LAB — LIPID PANEL W/REFLEX DIRECT LDL
Cholesterol: 173 mg/dL (ref <200)
HDL: 41 mg/dL (ref >=40)
LDL Cholesterol (Calc): 93 mg/dL (calc)
Non-HDL Cholesterol (Calc): 132 mg/dL (calc) — ABNORMAL HIGH (ref <130)
Total CHOL/HDL Ratio: 4.2 (calc) (ref <5.0)
Triglycerides: 293 mg/dL — ABNORMAL HIGH (ref <150)

## 2022-10-25 LAB — COMPLETE METABOLIC PANEL WITH GFR
AG Ratio: 1.7 (calc) (ref 1.0–2.5)
ALT: 45 U/L (ref 9–46)
AST: 49 U/L — ABNORMAL HIGH (ref 10–35)
Albumin: 4.5 g/dL (ref 3.6–5.1)
Alkaline phosphatase (APISO): 32 U/L — ABNORMAL LOW (ref 35–144)
BUN/Creatinine Ratio: 14 (calc) (ref 6–22)
BUN: 20 mg/dL (ref 7–25)
CO2: 26 mmol/L (ref 20–32)
Calcium: 9.8 mg/dL (ref 8.6–10.3)
Chloride: 99 mmol/L (ref 98–110)
Creat: 1.38 mg/dL — ABNORMAL HIGH (ref 0.70–1.35)
Globulin: 2.7 g/dL (calc) (ref 1.9–3.7)
Glucose, Bld: 149 mg/dL — ABNORMAL HIGH (ref 65–99)
Potassium: 3.8 mmol/L (ref 3.5–5.3)
Sodium: 138 mmol/L (ref 135–146)
Total Bilirubin: 0.7 mg/dL (ref 0.2–1.2)
Total Protein: 7.2 g/dL (ref 6.1–8.1)
eGFR: 57 mL/min/{1.73_m2} — ABNORMAL LOW (ref >=60)

## 2022-10-25 LAB — TSH: TSH: 0.93 mIU/L (ref 0.40–4.50)

## 2022-10-25 LAB — MAGNESIUM: Magnesium: 1.3 mg/dL — ABNORMAL LOW (ref 1.5–2.5)

## 2022-10-25 LAB — MICROALBUMIN / CREATININE URINE RATIO
Creatinine, Urine: 55 mg/dL (ref 20–320)
Microalb, Ur: <0.2 mg/dL

## 2022-11-05 ENCOUNTER — Telehealth: Payer: Self-pay | Admitting: Family Medicine

## 2022-11-05 ENCOUNTER — Other Ambulatory Visit: Payer: Self-pay | Admitting: Family Medicine

## 2022-11-05 DIAGNOSIS — E1165 Type 2 diabetes mellitus with hyperglycemia: Secondary | ICD-10-CM

## 2022-11-05 MED ORDER — CIPROFLOXACIN-DEXAMETHASONE 0.3-0.1 % OT SUSP: 4.0000 [drp] | Freq: Two times a day (BID) | OTIC | 0 refills | Status: AC

## 2022-11-05 MED ORDER — METFORMIN HCL ER 500 MG PO TB24
ORAL_TABLET | ORAL | 3 refills | Status: DC
Start: 2022-11-05 — End: 2022-11-21

## 2022-11-05 NOTE — Telephone Encounter (Signed)
Patient called he says his ears still hurt and he also changed his metformin dosage 500mg  from 1 pill to 2 pills so he is running out quicker  [facility] [address] [place]  [phone]

## 2022-11-05 NOTE — Telephone Encounter (Signed)
LM for pt to return call if he had any questions and that new medication had been sent in. Roselyn Reef, CMA

## 2022-11-08 ENCOUNTER — Other Ambulatory Visit: Payer: Self-pay | Admitting: Family Medicine

## 2022-11-08 DIAGNOSIS — I10 Essential (primary) hypertension: Secondary | ICD-10-CM

## 2022-11-21 ENCOUNTER — Other Ambulatory Visit: Payer: Self-pay | Admitting: Family Medicine

## 2022-11-21 DIAGNOSIS — E1165 Type 2 diabetes mellitus with hyperglycemia: Secondary | ICD-10-CM

## 2022-11-28 DIAGNOSIS — K429 Umbilical hernia without obstruction or gangrene: Secondary | ICD-10-CM | POA: Diagnosis not present

## 2022-12-26 ENCOUNTER — Other Ambulatory Visit: Payer: Self-pay | Admitting: Family Medicine

## 2022-12-26 DIAGNOSIS — I10 Essential (primary) hypertension: Secondary | ICD-10-CM

## 2023-01-13 ENCOUNTER — Other Ambulatory Visit: Payer: Self-pay | Admitting: Family Medicine

## 2023-01-20 ENCOUNTER — Other Ambulatory Visit: Payer: Self-pay | Admitting: Family Medicine

## 2023-01-23 ENCOUNTER — Ambulatory Visit: Payer: BC Managed Care – PPO | Admitting: Physician Assistant

## 2023-01-23 ENCOUNTER — Encounter: Payer: Self-pay | Admitting: Physician Assistant

## 2023-01-23 VITALS — BP 104/70 | HR 100 | Resp 20 | Ht 69.0 in | Wt 226.1 lb

## 2023-01-23 DIAGNOSIS — H9191 Unspecified hearing loss, right ear: Secondary | ICD-10-CM

## 2023-01-23 DIAGNOSIS — H6121 Impacted cerumen, right ear: Secondary | ICD-10-CM | POA: Insufficient documentation

## 2023-01-23 DIAGNOSIS — H60391 Other infective otitis externa, right ear: Secondary | ICD-10-CM | POA: Diagnosis not present

## 2023-01-23 HISTORY — DX: Unspecified hearing loss, right ear: H91.91

## 2023-01-23 HISTORY — DX: Impacted cerumen, right ear: H61.21

## 2023-01-23 MED ORDER — CLOTRIMAZOLE 1 % EX SOLN
CUTANEOUS | 0 refills | Status: AC
Start: 2023-01-23 — End: ?

## 2023-01-23 NOTE — Patient Instructions (Signed)
Otitis Externa  Otitis externa is an infection of the outer ear canal. The outer ear canal is the area between the outside of the ear and the eardrum. Otitis externa is sometimes called swimmer's ear. What are the causes? Common causes of this condition include: Swimming in dirty water. Moisture in the ear. An injury to the inside of the ear. An object stuck in the ear. A cut or scrape on the outside of the ear or in the ear canal. What increases the risk? You are more likely to get this condition if you go swimming often. What are the signs or symptoms? Itching in the ear. This is often the first symptom. Swelling of the ear. Redness in the ear. Ear pain. The pain may get worse when you pull on your ear. Pus coming from the ear. How is this treated? This condition may be treated with: Antibiotic ear drops. These are often given for 10-14 days. Medicines to reduce itching and swelling. Follow these instructions at home: If you were prescribed antibiotic ear drops, use them as told by your doctor. Do not stop using them even if you start to feel better. Take over-the-counter and prescription medicines only as told by your doctor. Avoid getting water in your ears as told by your doctor. You may be told to avoid swimming or water sports for a few days. Keep all follow-up visits. How is this prevented? Keep your ears dry. Use the corner of a towel to dry your ears after you swim or bathe. Try not to scratch or put things in your ear. Doing these things makes it easier for germs to grow in your ear. Avoid swimming in lakes, dirty water, or swimming pools that may not have the right amount of a chemical called chlorine. Contact a doctor if: You have a fever. Your ear is still red, swollen, or painful after 3 days. You still have pus coming from your ear after 3 days. Your redness, swelling, or pain gets worse. You have a very bad headache. Get help right away if: You have redness,  swelling, and pain or tenderness behind your ear. Summary Otitis externa is an infection of the outer ear canal. Symptoms include pain, redness, and swelling of the ear. If you were prescribed antibiotic ear drops, use them as told by your doctor. Do not stop using them even if you start to feel better. Try not to scratch or put things in your ear. This information is not intended to replace advice given to you by your health care provider. Make sure you discuss any questions you have with your health care provider. Document Revised: 07/13/2020 Document Reviewed: 07/13/2020 Elsevier Patient Education  2024 Elsevier Inc.  

## 2023-01-23 NOTE — Progress Notes (Signed)
Acute Office Visit  Subjective:     Patient ID: Brandon Conrad, male    DOB: 18-Mar-1956, 67 y.o.   MRN: [id number]  Chief Complaint  Patient presents with   EAR CLOGGED    RIGHT    HPI Patient is in today for right ear hearing loss for 2 days. He has had problems with his right ear for the last year. He has been given oral and topical antibiotics which some have helped very temporarily and other have not really done anything. He noticed hearing was getting worse but for the last 2 days he can not hear anything at all. He has a lot of right ear pressure. No sinus pressure, ear pain, ST, fever, chills.   .. Active Ambulatory Problems    Diagnosis Date Noted   History of episode of anxiety 02/18/2015   H/O cardiovascular stress test 09/03/2019   Type 2 diabetes mellitus with obesity (HCC) 05/17/2021   Hyperlipidemia associated with type 2 diabetes mellitus (HCC) 05/17/2021   Abnormal weight gain 05/17/2021   Uncontrolled type 2 diabetes mellitus with hyperglycemia, without long-term current use of insulin (HCC) 08/04/2021   Hyponatremia 08/04/2021   Hypomagnesemia 08/04/2021   LFT elevation 08/04/2021   Hypertriglyceridemia 08/04/2021   Obesity (BMI 30-39.9) 08/05/2021   Low testosterone 03/27/2022   Otitis media 06/08/2022   Umbilical hernia without obstruction and without gangrene 10/24/2022   Otitis externa 10/24/2022   Impacted cerumen of right ear 01/23/2023   Acute hearing loss of right ear 01/23/2023   Resolved Ambulatory Problems    Diagnosis Date Noted   Palpitations 02/18/2015   Essential hypertension 02/18/2015   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     Objective:    BP 104/70 (BP Location: Right Arm, Cuff Size: Large)   Pulse 100   Resp 20   Ht 5\' 9"  (1.753 m)   Wt 226 lb 1.9 oz (102.6 kg)   SpO2 94%   BMI 33.39 kg/m  BP Readings from Last 3 Encounters:  01/23/23 104/70  10/24/22 119/75  06/08/22 120/78   Wt Readings from Last 3 Encounters:  01/23/23 226 lb 1.9 oz (102.6 kg)  10/24/22 220 lb (99.8 kg)  06/08/22 215 lb 8 oz (97.8 kg)    .Marland KitchenCerumen Removal Template: Indication: Cerumen impaction of the ear(s) Medical necessity statement: On physical examination, cerumen impairs clinically significant portions of the external auditory canal, and tympanic membrane. Noted obstructive, copious cerumen that cannot be removed without magnification and instrumentations requiring physician skills Consent: Discussed benefits and risks of procedure and verbal consent obtained Procedure: Patient was prepped for the procedure. Utilized an otoscope to assess and take note of the ear canal, the tympanic membrane, and the presence, amount, and placement of the cerumen. Gentle water irrigation and soft plastic curette was utilized to remove cerumen.  Post procedure examination shows cerumen was completely removed. Patient tolerated procedure well. The patient is made aware that they may experience temporary vertigo, temporary hearing loss, and temporary discomfort. If these symptom last for more than 24 hours to call the clinic or proceed to the ED.   Physical Exam Constitutional:  Appearance: Normal appearance.  HENT:     Head: Normocephalic.     Ears:     Comments: Left narrow external canal with limited view of TM but appears to have no abnormality Gross hearing intact  Right narrow external canal with cerumen impaction After irrigation there was appearance of cottage cheese like white discharge on TM No gross hearing even after irrigation  Cardiovascular:     Rate and Rhythm: Normal rate.  Pulmonary:     Effort:  Pulmonary effort is normal.  Neurological:     Mental Status: He is alert.          Assessment & Plan:  Marland KitchenMarland KitchenWillem was seen today for ear clogged.  Diagnoses and all orders for this visit:  Other infective acute otitis externa of right ear -     clotrimazole (LOTRIMIN) 1 % external solution; 4 drops in the affected ear 4 times a day for 10 days -     Ambulatory referral to ENT  Acute hearing loss of right ear -     Ambulatory referral to ENT  Impacted cerumen of right ear   Right ear was irrigated and cerumen was removed but hearing loss remained and white thick discharge seen over right TM Will treat with anti-fungal drops and make ENT referral   Tandy Gaw, PA-C

## 2023-01-31 ENCOUNTER — Other Ambulatory Visit: Payer: Self-pay | Admitting: Family Medicine

## 2023-01-31 DIAGNOSIS — E1165 Type 2 diabetes mellitus with hyperglycemia: Secondary | ICD-10-CM

## 2023-02-12 ENCOUNTER — Other Ambulatory Visit: Payer: Self-pay | Admitting: Family Medicine

## 2023-02-12 DIAGNOSIS — I10 Essential (primary) hypertension: Secondary | ICD-10-CM

## 2023-02-18 ENCOUNTER — Other Ambulatory Visit: Payer: Self-pay

## 2023-02-18 MED ORDER — ACCU-CHEK GUIDE ME W/DEVICE KIT: PACK | 0 refills | Status: AC

## 2023-02-25 ENCOUNTER — Encounter: Payer: Self-pay | Admitting: Family Medicine

## 2023-02-25 ENCOUNTER — Ambulatory Visit: Payer: BC Managed Care – PPO | Admitting: Family Medicine

## 2023-02-25 VITALS — BP 130/95 | HR 98 | Ht 69.0 in | Wt 223.5 lb

## 2023-02-25 DIAGNOSIS — E1165 Type 2 diabetes mellitus with hyperglycemia: Secondary | ICD-10-CM | POA: Diagnosis not present

## 2023-02-25 DIAGNOSIS — R252 Cramp and spasm: Secondary | ICD-10-CM

## 2023-02-25 DIAGNOSIS — E1169 Type 2 diabetes mellitus with other specified complication: Secondary | ICD-10-CM

## 2023-02-25 DIAGNOSIS — E669 Obesity, unspecified: Secondary | ICD-10-CM

## 2023-02-25 DIAGNOSIS — Z7984 Long term (current) use of oral hypoglycemic drugs: Secondary | ICD-10-CM

## 2023-02-25 DIAGNOSIS — I10 Essential (primary) hypertension: Secondary | ICD-10-CM | POA: Diagnosis not present

## 2023-02-25 DIAGNOSIS — Z1211 Encounter for screening for malignant neoplasm of colon: Secondary | ICD-10-CM

## 2023-02-25 DIAGNOSIS — E785 Hyperlipidemia, unspecified: Secondary | ICD-10-CM

## 2023-02-25 LAB — POCT GLYCOSYLATED HEMOGLOBIN (HGB A1C): Hemoglobin A1C: 7.6 % — AB (ref 4.0–5.6)

## 2023-02-25 MED ORDER — TIRZEPATIDE 7.5 MG/0.5ML ~~LOC~~ SOAJ: 7.5000 mg | SUBCUTANEOUS | 1 refills | Status: DC

## 2023-02-25 MED ORDER — TIRZEPATIDE 2.5 MG/0.5ML ~~LOC~~ SOAJ: 2.5000 mg | SUBCUTANEOUS | 0 refills | Status: DC

## 2023-02-25 MED ORDER — TIRZEPATIDE 5 MG/0.5ML ~~LOC~~ SOAJ: 5.0000 mg | SUBCUTANEOUS | 0 refills | Status: DC

## 2023-02-25 MED ORDER — CYCLOBENZAPRINE HCL 10 MG PO TABS
10.0000 mg | ORAL_TABLET | Freq: Three times a day (TID) | ORAL | 1 refills | Status: DC | PRN
Start: 2023-02-25 — End: 2023-07-01

## 2023-02-25 NOTE — Assessment & Plan Note (Signed)
Blood pressure is well-controlled at this time.  He will continue valsartan and chlorthalidone.

## 2023-02-25 NOTE — Progress Notes (Signed)
Brandon Conrad - 67 y.o. male MRN [id number]  Date of birth: 07-19-1955  Subjective Chief Complaint  Patient presents with   Medical Management of Chronic Issues    HTN, DM last A1C 7.3    HPI Brandon Conrad is a 67 y.o. male here today for follow up visit.   He reports that he is doing pretty well.   He had stopped Ozempic and basaglar. Only taking metformin ER and novolog per sliding scale.  A1c has increased some to 7.6%.  He is not monitoring blood sugars at home.    BP is mildly elevated today.  He is taking valsartan and chlorthalidone.  He is tolerating these well.  Denies chest pain, shortness of breath, palpitations, headache or vision changes.    ROS:  A comprehensive ROS was completed and negative except as noted per HPI  Allergies  Allergen Reactions   Bee Venom Anaphylaxis   Cephalosporins Rash    Past Medical History:  Diagnosis Date   Anxiety    Hypertension     Past Surgical History:  Procedure Laterality Date   APPENDECTOMY  2011    Social History   Socioeconomic History   Marital status: Divorced    Spouse name: Not on file   Number of children: Not on file   Years of education: Not on file   Highest education level: Some college, no degree  Occupational History   Not on file  Tobacco Use   Smoking status: Former    Current packs/day: 0.00    Types: Cigarettes    Quit date: 02/16/2015    Years since quitting: 8.0   Smokeless tobacco: Never   Tobacco comments:    currently smoking 3 - 4 cigs/day  Substance and Sexual Activity   Alcohol use: Yes    Alcohol/week: 12.0 standard drinks of alcohol    Types: 12 Standard drinks or equivalent per week    Comment: 1-2 beers q day   Drug use: No   Sexual activity: Yes    Partners: Female  Other Topics Concern   Not on file  Social History Narrative   Not on file   Social Determinants of Health   Financial Resource Strain: Medium Risk (02/24/2023)   Overall Financial Resource Strain (CARDIA)     Difficulty of Paying Living Expenses: Somewhat hard  Food Insecurity: Food Insecurity Present (02/24/2023)   Hunger Vital Sign    Worried About Running Out of Food in the Last Year: Never true    Ran Out of Food in the Last Year: Sometimes true  Transportation Needs: No Transportation Needs (02/24/2023)   PRAPARE - Administrator, Civil Service (Medical): No    Lack of Transportation (Non-Medical): No  Physical Activity: Sufficiently Active (02/24/2023)   Exercise Vital Sign    Days of Exercise per Week: 5 days    Minutes of Exercise per Session: 110 min  Stress: No Stress Concern Present (02/24/2023)   [employer] of Occupational Health - Occupational Stress Questionnaire    Feeling of Stress : Only a little  Social Connections: Moderately Isolated (02/24/2023)   Social Connection and Isolation Panel [NHANES]    Frequency of Communication with Friends and Family: More than three times a week    Frequency of Social Gatherings with Friends and Family: More than three times a week    Attends Religious Services: 1 to 4 times per year    Active Member of Clubs or Organizations: No    Attends  Banker Meetings: Not on file    Marital Status: Divorced    History reviewed. No pertinent family history.  Health Maintenance  Topic Date Due   Colonoscopy  Never done   Zoster Vaccines- Shingrix (1 of 2) 04/24/2023 (Originally 03/03/2008)   INFLUENZA VACCINE  08/12/2023 (Originally 12/13/2022)   OPHTHALMOLOGY EXAM  03/18/2023   HEMOGLOBIN A1C  08/26/2023   Diabetic kidney evaluation - eGFR measurement  10/24/2023   Diabetic kidney evaluation - Urine ACR  10/24/2023   FOOT EXAM  10/24/2023   DTaP/Tdap/Td (2 - Td or Tdap) 09/12/2027   Hepatitis C Screening  Completed   HIV Screening  Completed   HPV VACCINES  Aged Out   COVID-19 Vaccine  Discontinued      ----------------------------------------------------------------------------------------------------------------------------------------------------------------------------------------------------------------- Physical Exam BP (!) 130/95 (BP Location: Left Arm, Patient Position: Sitting, Cuff Size: Large)   Pulse 98   Ht 5\' 9"  (1.753 m)   Wt 223 lb 8 oz (101.4 kg)   SpO2 96%   BMI 33.01 kg/m   Physical Exam Constitutional:      Appearance: Normal appearance.  Eyes:     General: No scleral icterus. Cardiovascular:     Rate and Rhythm: Normal rate and regular rhythm.  Pulmonary:     Effort: Pulmonary effort is normal.     Breath sounds: Normal breath sounds.  Neurological:     Mental Status: He is alert.  Psychiatric:        Mood and Affect: Mood normal.        Behavior: Behavior normal.     ------------------------------------------------------------------------------------------------------------------------------------------------------------------------------------------------------------------- Assessment and Plan  Type 2 diabetes mellitus with obesity (HCC) Slight worsening of blood sugar control since last visit.  Did not tolerate Ozempic very well.  Will see if he tolerates Mounjaro better.  Continue metformin and NovoLog per sliding scale.  Hopefully we can get him off NovoLog.  Hyperlipidemia associated with type 2 diabetes mellitus (HCC) Recommend continuation of atorvastatin.  Essential hypertension Blood pressure is well-controlled at this time.  He will continue valsartan and chlorthalidone.   Meds ordered this encounter  Medications   cyclobenzaprine (FLEXERIL) 10 MG tablet    Sig: Take 1 tablet (10 mg total) by mouth 3 (three) times daily as needed for muscle spasms.    Dispense:  90 tablet    Refill:  1   tirzepatide (MOUNJARO) 2.5 MG/0.5ML Pen    Sig: Inject 2.5 mg into the skin once a week.    Dispense:  2 mL    Refill:  0   tirzepatide  (MOUNJARO) 5 MG/0.5ML Pen    Sig: Inject 5 mg into the skin once a week.    Dispense:  6 mL    Refill:  0   tirzepatide (MOUNJARO) 7.5 MG/0.5ML Pen    Sig: Inject 7.5 mg into the skin once a week.    Dispense:  2 mL    Refill:  1    Return in about 4 months (around 06/28/2023) for Type 2 Diabetes.    This visit occurred during the SARS-CoV-2 public health emergency.  Safety protocols were in place, including screening questions prior to the visit, additional usage of staff PPE, and extensive cleaning of exam room while observing appropriate contact time as indicated for disinfecting solutions.

## 2023-02-25 NOTE — Assessment & Plan Note (Signed)
Recommend continuation of atorvastatin.

## 2023-02-25 NOTE — Assessment & Plan Note (Signed)
Slight worsening of blood sugar control since last visit.  Did not tolerate Ozempic very well.  Will see if he tolerates Mounjaro better.  Continue metformin and NovoLog per sliding scale.  Hopefully we can get him off NovoLog.

## 2023-02-27 ENCOUNTER — Other Ambulatory Visit: Payer: Self-pay | Admitting: Family Medicine

## 2023-02-27 DIAGNOSIS — E1165 Type 2 diabetes mellitus with hyperglycemia: Secondary | ICD-10-CM

## 2023-03-15 DIAGNOSIS — H903 Sensorineural hearing loss, bilateral: Secondary | ICD-10-CM | POA: Diagnosis not present

## 2023-03-15 DIAGNOSIS — J309 Allergic rhinitis, unspecified: Secondary | ICD-10-CM | POA: Diagnosis not present

## 2023-04-18 ENCOUNTER — Other Ambulatory Visit: Payer: Self-pay | Admitting: Family Medicine

## 2023-04-26 DIAGNOSIS — J309 Allergic rhinitis, unspecified: Secondary | ICD-10-CM | POA: Diagnosis not present

## 2023-04-26 DIAGNOSIS — H903 Sensorineural hearing loss, bilateral: Secondary | ICD-10-CM | POA: Diagnosis not present

## 2023-05-05 ENCOUNTER — Other Ambulatory Visit: Payer: Self-pay | Admitting: Family Medicine

## 2023-05-05 DIAGNOSIS — I10 Essential (primary) hypertension: Secondary | ICD-10-CM

## 2023-05-10 ENCOUNTER — Other Ambulatory Visit: Payer: Self-pay | Admitting: Family Medicine

## 2023-05-10 MED ORDER — OLMESARTAN MEDOXOMIL 40 MG PO TABS: 40.0000 mg | ORAL_TABLET | Freq: Every day | ORAL | 1 refills | Status: DC

## 2023-05-10 NOTE — Telephone Encounter (Signed)
Original Rx was for Valsartan 320mg  not Losartan 100mg 

## 2023-05-14 ENCOUNTER — Other Ambulatory Visit: Payer: Self-pay | Admitting: Family Medicine

## 2023-05-15 ENCOUNTER — Other Ambulatory Visit: Payer: Self-pay | Admitting: Family Medicine

## 2023-05-27 ENCOUNTER — Other Ambulatory Visit: Payer: Self-pay | Admitting: Family Medicine

## 2023-05-27 NOTE — Telephone Encounter (Signed)
 Requesting rx rf f vitamin D Last written 11/08/2022 Last OV 02/25/2023 Last VIT D lab work=09/14/2021 ( result=13) Upcoming appt =07/01/2023

## 2023-06-13 ENCOUNTER — Ambulatory Visit: Payer: Self-pay | Admitting: Family Medicine

## 2023-06-13 NOTE — Telephone Encounter (Signed)
  Chief Complaint: lightheaded, SOB, muscle cramps Symptoms: lightheaded, SOB, chest pain, muscle spasms, anxiety, palpitations Frequency: gradual, ongoing x 1 month. Pertinent Negatives: Patient denies dizziness. Disposition: [x] ED /[] Urgent Care (no appt availability in office) / [] Appointment(In office/virtual)/ []  [facility]/ [] Home Care/ [] Refused Recommended Disposition /[] [facility]/ []  Follow-up with PCP Additional Notes: Patient states his biggest concern is his "muscle loss". Patient complains of feeling lightheaded and SOB with sharp rapid chest pains, sometimes like his heart is racing. Patient states he feels like a hypochondriac. Advised patient go to ED due to severity of symptoms, he states he would rather come in for an EKG or appointment before he feels comfortable going. Called [facility] and notified staff of patient's refusal.  Copied from CRM [id number]. Topic: General - Other >> Jun 13, 2023  3:16 PM Twila L wrote: Reason for CRM: loss of breath, lightheaded,muscle spams Reason for Disposition  Difficulty breathing  Answer Assessment - Initial Assessment Questions 1. DESCRIPTION: "Describe your dizziness."     "It's not really a dizzy feeling, it's like all the sudden it feels like a loss of oxygen to my head"  2. LIGHTHEADED: "Do you feel lightheaded?" (e.g., somewhat faint, woozy, weak upon standing)     Patient describes it as lightheaded and states he does not think he will faint. He states he feels more winded.  3. VERTIGO: "Do you feel like either you or the room is spinning or tilting?" (i.e. vertigo)     Denies.  4. SEVERITY: "How bad is it?"  "Do you feel like you are going to faint?" "Can you stand and walk?"   - MILD: Feels slightly dizzy, but walking normally.   - MODERATE: Feels unsteady when walking, but not falling; interferes with normal activities (e.g., school, work).   - SEVERE: Unable to walk without falling, or requires assistance  to walk without falling; feels like passing out now.      Patient states he does not feel dizzy it is more lightheaded or SOB after walking just up the stairs in his house he feels winded.  5. ONSET:  "When did the dizziness begin?"     Patient states maybe over a month ago; gradual.  6. AGGRAVATING FACTORS: "Does anything make it worse?" (e.g., standing, change in head position)     Standing up and sudden movements make it worse.  7. HEART RATE: "Can you tell me your heart rate?" "How many beats in 15 seconds?"  (Note: not all patients can do this)       Denies having a heart rate monitor but states he feels his heart racing sometimes.  8. CAUSE: "What do you think is causing the dizziness?"     Unsure, states "there just seems to be something wrong". Patient states he sees his muscles cramping and he thinks it might be related to that.  9. RECURRENT SYMPTOM: "Have you had dizziness before?" If Yes, ask: "When was the last time?" "What happened that time?"     Denies.  10. OTHER SYMPTOMS: "Do you have any other symptoms?" (e.g., fever, chest pain, vomiting, diarrhea, bleeding)       Sharp rapid chest pain (feels different than his heartburn), muscle spasms, SOB (states moderate to severe), panic attacks.  Protocols used: Dizziness - Lightheadedness-A-AH

## 2023-06-18 ENCOUNTER — Ambulatory Visit: Payer: Self-pay | Admitting: Family Medicine

## 2023-06-18 NOTE — Telephone Encounter (Signed)
Spoke with patient. He is agreeable and is going to go to  ER this morning.

## 2023-06-27 ENCOUNTER — Telehealth: Payer: Self-pay

## 2023-06-27 DIAGNOSIS — R079 Chest pain, unspecified: Secondary | ICD-10-CM

## 2023-06-27 NOTE — Telephone Encounter (Signed)
Copied from CRM [id number]. Topic: Referral - Request for Referral >> Jun 27, 2023 10:51 AM Nila Nephew wrote: Did the patient discuss referral with their provider in the last year? No  Appointment offered? No   Type of order/referral and detailed reason for visit: Cardiology - [facility] was supposed to refer him within 72 hours but they never did  Preference of office, provider, location: None  If referral order, have you been seen by this specialty before? Yes - year and a half to two years ago - [facility] per patient   Can we respond through MyChart? Yes - Phone Call OK too.

## 2023-07-01 ENCOUNTER — Ambulatory Visit: Payer: BC Managed Care – PPO | Admitting: Family Medicine

## 2023-07-01 ENCOUNTER — Encounter: Payer: Self-pay | Admitting: Family Medicine

## 2023-07-01 VITALS — BP 114/72 | HR 103 | Ht 69.0 in | Wt 228.0 lb

## 2023-07-01 DIAGNOSIS — E669 Obesity, unspecified: Secondary | ICD-10-CM | POA: Diagnosis not present

## 2023-07-01 DIAGNOSIS — R252 Cramp and spasm: Secondary | ICD-10-CM | POA: Diagnosis not present

## 2023-07-01 DIAGNOSIS — I10 Essential (primary) hypertension: Secondary | ICD-10-CM

## 2023-07-01 DIAGNOSIS — Z7984 Long term (current) use of oral hypoglycemic drugs: Secondary | ICD-10-CM

## 2023-07-01 DIAGNOSIS — E1169 Type 2 diabetes mellitus with other specified complication: Secondary | ICD-10-CM

## 2023-07-01 DIAGNOSIS — I209 Angina pectoris, unspecified: Secondary | ICD-10-CM | POA: Insufficient documentation

## 2023-07-01 HISTORY — DX: Angina pectoris, unspecified: I20.9

## 2023-07-01 LAB — POCT GLYCOSYLATED HEMOGLOBIN (HGB A1C): HbA1c, POC (controlled diabetic range): 7.5 % — AB (ref 0.0–7.0)

## 2023-07-01 MED ORDER — OZEMPIC (0.25 OR 0.5 MG/DOSE) 2 MG/3ML ~~LOC~~ SOPN: PEN_INJECTOR | SUBCUTANEOUS | 3 refills | Status: DC

## 2023-07-01 MED ORDER — OMEGA-3-ACID ETHYL ESTERS 1 G PO CAPS: 1.0000 g | ORAL_CAPSULE | Freq: Every day | ORAL | 3 refills | Status: AC

## 2023-07-01 MED ORDER — CYCLOBENZAPRINE HCL 10 MG PO TABS: 10.0000 mg | ORAL_TABLET | Freq: Three times a day (TID) | ORAL | 1 refills | Status: DC | PRN

## 2023-07-01 MED ORDER — ATORVASTATIN CALCIUM 40 MG PO TABS: 40.0000 mg | ORAL_TABLET | Freq: Every day | ORAL | 3 refills | Status: DC

## 2023-07-01 NOTE — Assessment & Plan Note (Signed)
Blood pressure is well-controlled at this time.  He will continue valsartan and chlorthalidone.

## 2023-07-01 NOTE — Progress Notes (Signed)
 Brandon Conrad - 68 y.o. male MRN [id number]  Date of birth: 1956-03-30  Subjective Chief Complaint  Patient presents with   Medical Management of Chronic Issues    HPI Brandon Conrad is a 69 y.o. male here today for follow up.    He reports that he is doing okay.  He was seen at ED on 2/7 with complaint of chest pain.  Normal EKG and cardiac enzymes. Noted to have calcified coronaries and fatty liver.  Previous LDL of 93 on atorvastatin 20mg .  TG have remained elevated.  He was referred to cardiology through the ED but has not heard anything on this referral.  He is having episodic chest pressure with radiation into the left shoulder with activity  He is prescribed mounjaro, insulin and metformin for management of diabetes.  He did discontinue Mounjaro due to this seeming to worsen his eczema.  His A1c today is 7.5%..  Tolerating medications well at this time.  BP is well-controlled.. Remains on chlorthalidone and olmesartan.  Denies headache or vision changes.   ROS:  A comprehensive ROS was completed and negative except as noted per HPI  Allergies  Allergen Reactions   Bee Venom Anaphylaxis   Cephalosporins Rash    Past Medical History:  Diagnosis Date   Anxiety    Hypertension     Past Surgical History:  Procedure Laterality Date   APPENDECTOMY  2011    Social History   Socioeconomic History   Marital status: Divorced    Spouse name: Not on file   Number of children: Not on file   Years of education: Not on file   Highest education level: Some college, no degree  Occupational History   Not on file  Tobacco Use   Smoking status: Former    Current packs/day: 0.00    Types: Cigarettes    Quit date: 02/16/2015    Years since quitting: 8.3   Smokeless tobacco: Never   Tobacco comments:    currently smoking 3 - 4 cigs/day  Substance and Sexual Activity   Alcohol use: Yes    Alcohol/week: 12.0 standard drinks of alcohol    Types: 12 Standard drinks or equivalent per  week    Comment: 1-2 beers q day   Drug use: No   Sexual activity: Yes    Partners: Female  Other Topics Concern   Not on file  Social History Narrative   Not on file   Social Drivers of Health   Financial Resource Strain: Medium Risk (06/25/2023)   Overall Financial Resource Strain (CARDIA)    Difficulty of Paying Living Expenses: Somewhat hard  Food Insecurity: No Food Insecurity (06/25/2023)   Hunger Vital Sign    Worried About Running Out of Food in the Last Year: Never true    Ran Out of Food in the Last Year: Never true  Transportation Needs: No Transportation Needs (06/25/2023)   PRAPARE - Administrator, Civil Service (Medical): No    Lack of Transportation (Non-Medical): No  Physical Activity: Sufficiently Active (06/25/2023)   Exercise Vital Sign    Days of Exercise per Week: 5 days    Minutes of Exercise per Session: 60 min  Stress: No Stress Concern Present (06/25/2023)   [employer] of Occupational Health - Occupational Stress Questionnaire    Feeling of Stress : Only a little  Social Connections: Moderately Isolated (06/25/2023)   Social Connection and Isolation Panel [NHANES]    Frequency of Communication with Friends and Family:  More than three times a week    Frequency of Social Gatherings with Friends and Family: Three times a week    Attends Religious Services: 1 to 4 times per year    Active Member of Clubs or Organizations: No    Attends Banker Meetings: Not on file    Marital Status: Divorced    History reviewed. No pertinent family history.  Health Maintenance  Topic Date Due   OPHTHALMOLOGY EXAM  03/18/2023   INFLUENZA VACCINE  08/12/2023 (Originally 12/13/2022)   Zoster Vaccines- Shingrix (1 of 2) 09/28/2023 (Originally 03/03/2008)   Pneumonia Vaccine 63+ Years old (1 of 2 - PCV) 06/30/2024 (Originally [birth year]03/1964)   Colonoscopy  06/30/2024 (Originally 03/04/2003)   Diabetic kidney evaluation - eGFR measurement   10/24/2023   Diabetic kidney evaluation - Urine ACR  10/24/2023   FOOT EXAM  10/24/2023   HEMOGLOBIN A1C  12/29/2023   DTaP/Tdap/Td (2 - Td or Tdap) 09/12/2027   Hepatitis C Screening  Completed   HIV Screening  Completed   HPV VACCINES  Aged Out   COVID-19 Vaccine  Discontinued     ----------------------------------------------------------------------------------------------------------------------------------------------------------------------------------------------------------------- Physical Exam BP 114/72 (BP Location: Left Arm, Patient Position: Sitting, Cuff Size: Large)   Pulse (!) 103   Ht 5\' 9"  (1.753 m)   Wt 228 lb (103.4 kg)   SpO2 95%   BMI 33.67 kg/m   Physical Exam Constitutional:      Appearance: Normal appearance.  HENT:     Head: Normocephalic and atraumatic.  Eyes:     General: No scleral icterus. Cardiovascular:     Rate and Rhythm: Normal rate and regular rhythm.  Pulmonary:     Effort: Pulmonary effort is normal.     Breath sounds: Normal breath sounds.  Musculoskeletal:     Cervical back: Neck supple.  Neurological:     Mental Status: He is alert.  Psychiatric:        Mood and Affect: Mood normal.        Behavior: Behavior normal.     ------------------------------------------------------------------------------------------------------------------------------------------------------------------------------------------------------------------- Assessment and Plan  Type 2 diabetes mellitus with obesity (HCC) Blood sugars remain relatively unchanged.  Did not tolerate Mounjaro due to eczema-like rash.  Will try Ozempic and see if this is better tolerated.  Continue metformin with sliding scale NovoLog.  Essential hypertension Blood pressure is well-controlled at this time.  He will continue valsartan and chlorthalidone.  Angina pectoris (HCC) He has symptoms concerning for angina.  Recently seen in the ED and coronary calcifications noted on  CT scan.  I will have him increase his atorvastatin to 40 mg daily.  Referral placed to cardiology.   Meds ordered this encounter  Medications   cyclobenzaprine (FLEXERIL) 10 MG tablet    Sig: Take 1 tablet (10 mg total) by mouth 3 (three) times daily as needed for muscle spasms.    Dispense:  90 tablet    Refill:  1   omega-3 acid ethyl esters (LOVAZA) 1 g capsule    Sig: Take 1 capsule (1 g total) by mouth daily.    Dispense:  360 capsule    Refill:  3   atorvastatin (LIPITOR) 40 MG tablet    Sig: Take 1 tablet (40 mg total) by mouth daily.    Dispense:  90 tablet    Refill:  3   Semaglutide,0.25 or 0.5MG /DOS, (OZEMPIC, 0.25 OR 0.5 MG/DOSE,) 2 MG/3ML SOPN    Sig: Inject 0.25mg  weekly x4 weeks then increase to 0.5mg   Dispense:  3 mL    Refill:  3    Return in about 6 months (around 12/29/2023) for Type 2 Diabetes, Hypertension.    This visit occurred during the SARS-CoV-2 public health emergency.  Safety protocols were in place, including screening questions prior to the visit, additional usage of staff PPE, and extensive cleaning of exam room while observing appropriate contact time as indicated for disinfecting solutions.

## 2023-07-01 NOTE — Assessment & Plan Note (Signed)
 He has symptoms concerning for angina.  Recently seen in the ED and coronary calcifications noted on CT scan.  I will have him increase his atorvastatin to 40 mg daily.  Referral placed to cardiology.

## 2023-07-01 NOTE — Assessment & Plan Note (Signed)
 Blood sugars remain relatively unchanged.  Did not tolerate Mounjaro due to eczema-like rash.  Will try Ozempic and see if this is better tolerated.  Continue metformin with sliding scale NovoLog.

## 2023-07-04 ENCOUNTER — Telehealth: Payer: Self-pay

## 2023-07-04 NOTE — Telephone Encounter (Signed)
 Patient states has heard from cardiology referral yet .  Requesting that someone check into this for him.

## 2023-07-04 NOTE — Telephone Encounter (Signed)
 Patient informed.

## 2023-07-04 NOTE — Telephone Encounter (Signed)
 Copied from CRM [id number]. Topic: Clinical - Medication Question >> Jul 02, 2023 10:47 AM Nila Nephew wrote: Reason for CRM: Patient inquiry - if he blood sugar is still elevated after taking Ozempic, should he also use his novolog?

## 2023-08-09 ENCOUNTER — Other Ambulatory Visit: Payer: Self-pay | Admitting: Family Medicine

## 2023-08-09 DIAGNOSIS — I10 Essential (primary) hypertension: Secondary | ICD-10-CM

## 2023-08-22 ENCOUNTER — Other Ambulatory Visit: Payer: Self-pay

## 2023-08-22 DIAGNOSIS — F419 Anxiety disorder, unspecified: Secondary | ICD-10-CM | POA: Insufficient documentation

## 2023-08-22 DIAGNOSIS — I10 Essential (primary) hypertension: Secondary | ICD-10-CM | POA: Insufficient documentation

## 2023-08-23 ENCOUNTER — Encounter: Payer: Self-pay | Admitting: Cardiology

## 2023-08-23 ENCOUNTER — Ambulatory Visit: Attending: Cardiology | Admitting: Cardiology

## 2023-08-23 VITALS — BP 100/68 | HR 99 | Ht 67.0 in | Wt 224.1 lb

## 2023-08-23 DIAGNOSIS — E1169 Type 2 diabetes mellitus with other specified complication: Secondary | ICD-10-CM | POA: Diagnosis not present

## 2023-08-23 DIAGNOSIS — E669 Obesity, unspecified: Secondary | ICD-10-CM

## 2023-08-23 DIAGNOSIS — I10 Essential (primary) hypertension: Secondary | ICD-10-CM

## 2023-08-23 DIAGNOSIS — R079 Chest pain, unspecified: Secondary | ICD-10-CM | POA: Diagnosis not present

## 2023-08-23 DIAGNOSIS — E785 Hyperlipidemia, unspecified: Secondary | ICD-10-CM

## 2023-08-23 DIAGNOSIS — I700 Atherosclerosis of aorta: Secondary | ICD-10-CM

## 2023-08-23 NOTE — Progress Notes (Signed)
 Cardiology Office Note:    Date:  08/23/2023   ID:  Brandon Conrad, DOB 03-20-1956, MRN [id number]  PCP:  Everrett Coombe, DO  Cardiologist:  Garwin Brothers, MD   Referring MD: Everrett Coombe, DO    ASSESSMENT:    1. Essential hypertension   2. Hyperlipidemia associated with type 2 diabetes mellitus (HCC)   3. Type 2 diabetes mellitus with obesity (HCC)   4. Aortic atherosclerosis (HCC)   5. Chest pain of uncertain etiology   6. Morbid obesity (HCC)    PLAN:    In order of problems listed above:  Chest pain: He will need to be evaluated for this.  He has multiple risk factors for coronary artery disease and we will set him up for an exercise stress echo.  Chest pain is atypical in nature. Essential hypertension: Blood pressure is stable and diet was emphasized. Mixed dyslipidemia: On lipid-lowering medications.  Goal is not achieved.  He wants to go to month trial of diet lifestyle modification and come back for blood work in 2 months. Hypomagnesemia on replacement: We will check Chem-7 and magnesium level today. Diabetes mellitus and obesity: He promises to do better with diet.  If his stress test is negative then he plans to embark on a graded exercise program and explained to him the risks of uncontrolled diabetes and obesity and he promises to be focused and do better. Cardiac murmur: Echocardiogram will be done to assess murmur heard on auscultation. Aortic atherosclerosis: I discussed this with him and secondary prevention issues were discussed. Patient will be seen in follow-up appointment in 9 months or earlier if the patient has any concerns.    Medication Adjustments/Labs and Tests Ordered: Current medicines are reviewed at length with the patient today.  Concerns regarding medicines are outlined above.  Orders Placed This Encounter  Procedures   Basic Metabolic Panel (BMET)   Magnesium   Basic Metabolic Panel (BMET)   Hepatic function panel   Lipid Profile   HgB  A1c   EKG 12-Lead   ECHOCARDIOGRAM STRESS TEST   No orders of the defined types were placed in this encounter.    History of Present Illness:    Brandon Conrad is a 68 y.o. male who is being seen today for the evaluation of chest pain at the request of Everrett Coombe, DO.  Patient has past medical history of essential hypertension, dyslipidemia, diabetes mellitus, obesity and was admitted to the hospital or abdomen to the emergency room and February with chest pain.  He was evaluated and discharged and subsequently has not had any issues.  He takes care of activities of daily living.  He wants to get evaluated before he starts an intensive exercise program.  Currently he leads a sedentary lifestyle.  At the time of my evaluation, the patient is alert awake oriented and in no distress.  Past Medical History:  Diagnosis Date   Abnormal weight gain 05/17/2021   Acute hearing loss of right ear 01/23/2023   Angina pectoris (HCC) 07/01/2023   Anxiety    Essential hypertension 02/18/2015   Patient was on lisinopril for several months, blood count and rash, switch to beta blocker for treatment of hypertension/anxiety 04/2015     H/O cardiovascular stress test 09/03/2019   08/2019 at [facility] - hospitalized for ACS r/o:   NM stress test- IMPRESSION:  1. No reversible ischemia or infarction.  2. Normal left ventricular wall motion.  3. Left ventricular ejection fraction 73%  4. Non  invasive risk stratification*: Low          History of episode of anxiety 02/18/2015   Patient treated with Zoloft, fluoxetine, Lexapro - discontinue Lexapro after rash broke out, patient opts to DC antidepressant/antianxiety therapy with SSRI or other, I prescribed beta blocker 04/2015     Hyperlipidemia associated with type 2 diabetes mellitus (HCC) 05/17/2021   Hypertension    Hypertriglyceridemia 08/04/2021   Hypomagnesemia 08/04/2021   Hyponatremia 08/04/2021   Impacted cerumen of right ear 01/23/2023   LFT  elevation 08/04/2021   Low testosterone 03/27/2022   Obesity (BMI 30-39.9) 08/05/2021   Otitis externa 10/24/2022   Otitis media 06/08/2022   Type 2 diabetes mellitus with obesity (HCC) 05/17/2021   Umbilical hernia without obstruction and without gangrene 10/24/2022   Uncontrolled type 2 diabetes mellitus with hyperglycemia, without long-term current use of insulin (HCC) 08/04/2021    Past Surgical History:  Procedure Laterality Date   APPENDECTOMY  2011    Current Medications: Current Meds  Medication Sig   ACCU-CHEK GUIDE TEST test strip USE TO TEST BOLOOD GLOCSOE LEVELS UP TO 4 TIMES A DAY   aspirin 81 MG EC tablet Take 1 tablet (81 mg total) by mouth daily.   atorvastatin (LIPITOR) 40 MG tablet Take 1 tablet (40 mg total) by mouth daily.   Blood Glucose Monitoring Suppl (ACCU-CHEK GUIDE ME) w/Device KIT DISPENSE BASED ON PATIENT AND INSURANCE PREFERENCE. USE UP TO FOUR TIMES DAILY AS DIRECTED.   chlorthalidone (HYGROTON) 25 MG tablet TAKE 1 TABLET (25 MG TOTAL) BY MOUTH DAILY.   clotrimazole (LOTRIMIN) 1 % external solution 4 drops in the affected ear 4 times a day for 10 days   cyclobenzaprine (FLEXERIL) 10 MG tablet Take 1 tablet (10 mg total) by mouth 3 (three) times daily as needed for muscle spasms.   fenofibrate 160 MG tablet TAKE 1 TABLET BY MOUTH EVERY DAY   folic acid (FOLVITE) 1 MG tablet TAKE 1 TABLET BY MOUTH EVERY DAY   insulin aspart (NOVOLOG FLEXPEN) 100 UNIT/ML FlexPen IF BLOOD GLUCOSE IS 121-150: TAKE 2 UNITS, BG 151 - 200: TAKE 3 UNITS, BG 201 - 250:TAKE 5 UNITS, BG 251 - 300:TAKE 8 UNITS, BG 301 - 350: TAKE 11 UNITS, BG MORE THAN 351: TAKE 15 UNITS   Insulin Pen Needle 32G X 4 MM MISC 1 Act by Does not apply route 5 (five) times daily.   metFORMIN (GLUCOPHAGE-XR) 500 MG 24 hr tablet TAKE 1 TABLET BY MOUTH EVERY DAY WITH BREAKFAST   olmesartan (BENICAR) 40 MG tablet Take 1 tablet (40 mg total) by mouth daily.   omega-3 acid ethyl esters (LOVAZA) 1 g capsule Take  1 capsule (1 g total) by mouth daily.   omeprazole (PRILOSEC) 40 MG capsule TAKE 1 CAPSULE BY MOUTH EVERY DAY   Semaglutide,0.25 or 0.5MG /DOS, (OZEMPIC, 0.25 OR 0.5 MG/DOSE,) 2 MG/3ML SOPN Inject 0.25mg  weekly x4 weeks then increase to 0.5mg    Thiamine HCl (B-1) 100 MG TABS TAKE 1 TABLET BY MOUTH EVERY DAY   Vitamin D, Ergocalciferol, (DRISDOL) 1.25 MG (50000 UNIT) CAPS capsule TAKE 1 CAPSULE (50,000 UNITS TOTAL) BY MOUTH EVERY 7 (SEVEN) DAYS     Allergies:   Bee venom and Cephalosporins   Social History   Socioeconomic History   Marital status: Divorced    Spouse name: Not on file   Number of children: Not on file   Years of education: Not on file   Highest education level: Some college, no degree  Occupational  History   Not on file  Tobacco Use   Smoking status: Former    Current packs/day: 0.00    Types: Cigarettes    Quit date: 02/16/2015    Years since quitting: 8.5   Smokeless tobacco: Never   Tobacco comments:    currently smoking 3 - 4 cigs/day  Substance and Sexual Activity   Alcohol use: Yes    Alcohol/week: 12.0 standard drinks of alcohol    Types: 12 Standard drinks or equivalent per week    Comment: 1-2 beers q day   Drug use: No   Sexual activity: Yes    Partners: Female  Other Topics Concern   Not on file  Social History Narrative   Not on file   Social Drivers of Health   Financial Resource Strain: Medium Risk (06/25/2023)   Overall Financial Resource Strain (CARDIA)    Difficulty of Paying Living Expenses: Somewhat hard  Food Insecurity: No Food Insecurity (06/25/2023)   Hunger Vital Sign    Worried About Running Out of Food in the Last Year: Never true    Ran Out of Food in the Last Year: Never true  Transportation Needs: No Transportation Needs (06/25/2023)   PRAPARE - Administrator, Civil Service (Medical): No    Lack of Transportation (Non-Medical): No  Physical Activity: Sufficiently Active (06/25/2023)   Exercise Vital Sign    Days  of Exercise per Week: 5 days    Minutes of Exercise per Session: 60 min  Stress: No Stress Concern Present (06/25/2023)   [employer] of Occupational Health - Occupational Stress Questionnaire    Feeling of Stress : Only a little  Social Connections: Moderately Isolated (06/25/2023)   Social Connection and Isolation Panel [NHANES]    Frequency of Communication with Friends and Family: More than three times a week    Frequency of Social Gatherings with Friends and Family: Three times a week    Attends Religious Services: 1 to 4 times per year    Active Member of Clubs or Organizations: No    Attends Engineer, structural: Not on file    Marital Status: Divorced     Family History: The patient's family history includes Cancer in his mother; Diabetes in his mother. There is no history of Hypertension or Heart disease.  ROS:   Please see the history of present illness.    All other systems reviewed and are negative.  EKGs/Labs/Other Studies Reviewed:    The following studies were reviewed today:  EKG Interpretation Date/Time:  Friday August 23 2023 13:31:14 EDT Ventricular Rate:  99 PR Interval:  168 QRS Duration:  96 QT Interval:  346 QTC Calculation: 444 R Axis:   111  Text Interpretation: Normal sinus rhythm Left posterior fascicular block T wave abnormality, consider inferior ischemia When compared with ECG of 03-Aug-2021 17:29, PREVIOUS ECG IS PRESENT Confirmed by Belva Crome [phone]) on 08/23/2023 1:32:01 PM     Recent Labs: 10/24/2022: ALT 45; BUN 20; Creat 1.38; Hemoglobin 14.2; Magnesium 1.3; Platelets 279; Potassium 3.8; Sodium 138; TSH 0.93  Recent Lipid Panel    Component Value Date/Time   CHOL 173 10/24/2022 1534   TRIG 293 (H) 10/24/2022 1534   HDL 41 10/24/2022 1534   CHOLHDL 4.2 10/24/2022 1534   VLDL UNABLE TO CALCULATE IF TRIGLYCERIDE OVER 400 mg/dL 30/86/5784 6962   LDLCALC 93 10/24/2022 1534   LDLDIRECT 67.9 08/05/2021 0244    Physical  Exam:    VS:  BP  100/68   Pulse 99   Ht 5\' 7"  (1.702 m)   Wt 224 lb 1.9 oz (101.7 kg)   SpO2 94%   BMI 35.10 kg/m     Wt Readings from Last 3 Encounters:  08/23/23 224 lb 1.9 oz (101.7 kg)  07/01/23 228 lb (103.4 kg)  02/25/23 223 lb 8 oz (101.4 kg)     GEN: Patient is in no acute distress HEENT: Normal NECK: No JVD; No carotid bruits LYMPHATICS: No lymphadenopathy CARDIAC: S1 S2 regular, 2/6 systolic murmur at the apex. RESPIRATORY:  Clear to auscultation without rales, wheezing or rhonchi  ABDOMEN: Soft, non-tender, non-distended MUSCULOSKELETAL:  No edema; No deformity  SKIN: Warm and dry NEUROLOGIC:  Alert and oriented x 3 PSYCHIATRIC:  Normal affect    Signed, Garwin Brothers, MD  08/23/2023 1:58 PM    [facility]

## 2023-08-23 NOTE — Patient Instructions (Signed)
 Medication Instructions:  Your physician recommends that you continue on your current medications as directed. Please refer to the Current Medication list given to you today.  *If you need a refill on your cardiac medications before your next appointment, please call your pharmacy*  Lab Work: Your physician recommends that you return for lab work in:   Labs today: BMP, Magnesium Labs in 2 months: BMP, LFT, Lipids, Hbg A1c  If you have labs (blood work) drawn today and your tests are completely normal, you will receive your results only by: MyChart Message (if you have MyChart) OR A paper copy in the mail If you have any lab test that is abnormal or we need to change your treatment, we will call you to review the results.  Testing/Procedures: Your physician has requested that you have a stress echocardiogram. For further information please visit [email or web address] Please follow instruction sheet as given.  Please note: We ask at that you not bring children with you during ultrasound (echo/ vascular) testing. Due to room size and safety concerns, children are not allowed in the ultrasound rooms during exams. Our front office staff cannot provide observation of children in our lobby area while testing is being conducted. An adult accompanying a patient to their appointment will only be allowed in the ultrasound room at the discretion of the ultrasound technician under special circumstances. We apologize for any inconvenience.   Follow-Up: At [facility], you and your health needs are our priority.  As part of our continuing mission to provide you with exceptional heart care, our providers are all part of one team.  This team includes your primary Cardiologist (physician) and Advanced Practice Providers or APPs (Physician Assistants and Nurse Practitioners) who all work together to provide you with the care you need, when you need it.  Your next appointment:   1 year(s)  Provider:    Belva Crome, MD    We recommend signing up for the patient portal called "MyChart".  Sign up information is provided on this After Visit Summary.  MyChart is used to connect with patients for Virtual Visits (Telemedicine).  Patients are able to view lab/test results, encounter notes, upcoming appointments, etc.  Non-urgent messages can be sent to your provider as well.   To learn more about what you can do with MyChart, go to [email or web address].   Other Instructions None

## 2023-08-24 LAB — BASIC METABOLIC PANEL WITH GFR
BUN/Creatinine Ratio: 15 (ref 10–24)
BUN: 23 mg/dL (ref 8–27)
CO2: 24 mmol/L (ref 20–29)
Calcium: 10.1 mg/dL (ref 8.6–10.2)
Chloride: 96 mmol/L (ref 96–106)
Creatinine, Ser: 1.55 mg/dL — ABNORMAL HIGH (ref 0.76–1.27)
Glucose: 89 mg/dL (ref 70–99)
Potassium: 4 mmol/L (ref 3.5–5.2)
Sodium: 136 mmol/L (ref 134–144)
eGFR: 49 mL/min/{1.73_m2} — ABNORMAL LOW (ref >59)

## 2023-08-24 LAB — MAGNESIUM: Magnesium: 1.6 mg/dL (ref 1.6–2.3)

## 2023-09-02 ENCOUNTER — Telehealth: Payer: Self-pay | Admitting: Cardiology

## 2023-09-02 NOTE — Telephone Encounter (Signed)
 Office called asking that we fax a copy of patient office visit to them. Fax #[phone]

## 2023-09-07 ENCOUNTER — Other Ambulatory Visit: Payer: Self-pay | Admitting: Family Medicine

## 2023-09-07 DIAGNOSIS — R252 Cramp and spasm: Secondary | ICD-10-CM

## 2023-09-17 ENCOUNTER — Other Ambulatory Visit: Payer: Self-pay | Admitting: Family Medicine

## 2023-09-17 DIAGNOSIS — E1165 Type 2 diabetes mellitus with hyperglycemia: Secondary | ICD-10-CM

## 2023-09-20 ENCOUNTER — Encounter (HOSPITAL_COMMUNITY): Payer: Self-pay

## 2023-09-25 ENCOUNTER — Other Ambulatory Visit: Payer: Self-pay | Admitting: Family Medicine

## 2023-09-27 ENCOUNTER — Ambulatory Visit (HOSPITAL_COMMUNITY)
Admission: RE | Admit: 2023-09-27 | Discharge: 2023-09-27 | Disposition: A | Discharge status: Home / Self Care | Source: Ambulatory Visit | Attending: Internal Medicine | Admitting: Internal Medicine

## 2023-09-27 ENCOUNTER — Ambulatory Visit (HOSPITAL_COMMUNITY)
Admission: RE | Admit: 2023-09-27 | Discharge: 2023-09-27 | Disposition: A | Discharge status: Home / Self Care | Source: Ambulatory Visit | Attending: Cardiology | Admitting: Cardiology

## 2023-09-27 DIAGNOSIS — E785 Hyperlipidemia, unspecified: Secondary | ICD-10-CM | POA: Diagnosis present

## 2023-09-27 DIAGNOSIS — I10 Essential (primary) hypertension: Secondary | ICD-10-CM

## 2023-09-27 DIAGNOSIS — I700 Atherosclerosis of aorta: Secondary | ICD-10-CM | POA: Diagnosis present

## 2023-09-27 DIAGNOSIS — I480 Paroxysmal atrial fibrillation: Secondary | ICD-10-CM | POA: Diagnosis present

## 2023-09-27 DIAGNOSIS — E1169 Type 2 diabetes mellitus with other specified complication: Secondary | ICD-10-CM

## 2023-09-27 DIAGNOSIS — E119 Type 2 diabetes mellitus without complications: Secondary | ICD-10-CM

## 2023-09-27 DIAGNOSIS — R079 Chest pain, unspecified: Secondary | ICD-10-CM

## 2023-09-27 DIAGNOSIS — E669 Obesity, unspecified: Secondary | ICD-10-CM

## 2023-09-27 MED ORDER — PERFLUTREN LIPID MICROSPHERE
6.0000 mL | INTRAVENOUS | Status: AC | PRN
  Administered 2023-09-27: 6 mL via INTRAVENOUS

## 2023-10-01 ENCOUNTER — Ambulatory Visit: Payer: Self-pay | Admitting: Cardiology

## 2023-10-01 MED FILL — Perflutren Lipid Microsphere IV Susp 1.1 MG/ML: INTRAVENOUS | Qty: 6 | Status: AC

## 2023-10-09 ENCOUNTER — Other Ambulatory Visit: Payer: Self-pay | Admitting: Family Medicine

## 2023-10-14 ENCOUNTER — Other Ambulatory Visit: Payer: Self-pay | Admitting: Family Medicine

## 2023-10-14 DIAGNOSIS — Z1329 Encounter for screening for other suspected endocrine disorder: Secondary | ICD-10-CM

## 2023-10-17 ENCOUNTER — Other Ambulatory Visit: Payer: Self-pay | Admitting: Family Medicine

## 2023-10-20 ENCOUNTER — Other Ambulatory Visit: Payer: Self-pay | Admitting: Family Medicine

## 2023-11-13 ENCOUNTER — Other Ambulatory Visit: Payer: Self-pay | Admitting: Family Medicine

## 2023-12-09 ENCOUNTER — Other Ambulatory Visit: Payer: Self-pay | Admitting: Family Medicine

## 2023-12-09 DIAGNOSIS — R252 Cramp and spasm: Secondary | ICD-10-CM

## 2023-12-30 ENCOUNTER — Ambulatory Visit: Payer: BC Managed Care – PPO | Admitting: Family Medicine

## 2023-12-30 ENCOUNTER — Encounter: Payer: Self-pay | Admitting: Family Medicine

## 2023-12-30 VITALS — BP 106/72 | HR 109 | Ht 67.0 in | Wt 225.0 lb

## 2023-12-30 DIAGNOSIS — E669 Obesity, unspecified: Secondary | ICD-10-CM | POA: Diagnosis not present

## 2023-12-30 DIAGNOSIS — Z7984 Long term (current) use of oral hypoglycemic drugs: Secondary | ICD-10-CM

## 2023-12-30 DIAGNOSIS — I10 Essential (primary) hypertension: Secondary | ICD-10-CM | POA: Diagnosis not present

## 2023-12-30 DIAGNOSIS — E1169 Type 2 diabetes mellitus with other specified complication: Secondary | ICD-10-CM | POA: Diagnosis not present

## 2023-12-30 DIAGNOSIS — Z125 Encounter for screening for malignant neoplasm of prostate: Secondary | ICD-10-CM

## 2023-12-30 DIAGNOSIS — E785 Hyperlipidemia, unspecified: Secondary | ICD-10-CM

## 2023-12-30 LAB — POCT GLYCOSYLATED HEMOGLOBIN (HGB A1C): HbA1c, POC (controlled diabetic range): 6.7 % (ref 0.0–7.0)

## 2023-12-30 NOTE — Assessment & Plan Note (Signed)
Blood pressure is well-controlled at this time.  He will continue valsartan and chlorthalidone.

## 2023-12-30 NOTE — Assessment & Plan Note (Signed)
 Blood sugars are well-controlled.  Recommend continuation of current medications.  Continue with dietary change.

## 2023-12-30 NOTE — Assessment & Plan Note (Signed)
Recommend continuation of atorvastatin.

## 2023-12-30 NOTE — Progress Notes (Signed)
 Brandon Conrad - 68 y.o. male MRN [id number]  Date of birth: [birth year]  Subjective Chief Complaint  Patient presents with   Diabetes   Hypertension    HPI Brandon Conrad is a 68 year old male here today for follow-up visit.  Feeling a little anxious due to his job being transferred to [employer] .  States he may have to move but for now will be staying at long-term hotel.   Blood sugars remain well-controlled.  Doing well with current medications for management of his diabetes.  Denies symptoms of hypoglycemia.  A1c today is 6.7%.  Blood pressure is well-controlled.  Denies chest pain, shortness of breath, palpitations, headaches or vision changes.  ROS:  A comprehensive ROS was completed and negative except as noted per HPI  Allergies  Allergen Reactions   Bee Venom Anaphylaxis   Cephalosporins Rash    Past Medical History:  Diagnosis Date   Abnormal weight gain 05/17/2021   Acute hearing loss of right ear 01/23/2023   Angina pectoris (HCC) 07/01/2023   Anxiety    Essential hypertension 02/18/2015   Patient was on lisinopril  for several months, blood count and rash, switch to beta blocker for treatment of hypertension/anxiety 04/2015     H/O cardiovascular stress test 09/03/2019   08/2019 at [facility] - hospitalized for ACS r/o:   NM stress test- IMPRESSION:  1. No reversible ischemia or infarction.  2. Normal left ventricular wall motion.  3. Left ventricular ejection fraction 73%  4. Non invasive risk stratification*: Low          History of episode of anxiety 02/18/2015   Patient treated with Zoloft , fluoxetine , Lexapro  - discontinue Lexapro  after rash broke out, patient opts to DC antidepressant/antianxiety therapy with SSRI or other, I prescribed beta blocker 04/2015     Hyperlipidemia associated with type 2 diabetes mellitus (HCC) 05/17/2021   Hypertension    Hypertriglyceridemia 08/04/2021   Hypomagnesemia 08/04/2021   Hyponatremia 08/04/2021   Impacted  cerumen of right ear 01/23/2023   LFT elevation 08/04/2021   Low testosterone  03/27/2022   Obesity (BMI 30-39.9) 08/05/2021   Otitis externa 10/24/2022   Otitis media 06/08/2022   Type 2 diabetes mellitus with obesity (HCC) 05/17/2021   Umbilical hernia without obstruction and without gangrene 10/24/2022   Uncontrolled type 2 diabetes mellitus with hyperglycemia, without long-term current use of insulin  (HCC) 08/04/2021    Past Surgical History:  Procedure Laterality Date   APPENDECTOMY  2011    Social History   Socioeconomic History   Marital status: Divorced    Spouse name: Not on file   Number of children: Not on file   Years of education: Not on file   Highest education level: Some college, no degree  Occupational History   Not on file  Tobacco Use   Smoking status: Former    Current packs/day: 0.00    Types: Cigarettes    Quit date: 02/16/2015    Years since quitting: 8.8   Smokeless tobacco: Never   Tobacco comments:    currently smoking 3 - 4 cigs/day  Substance and Sexual Activity   Alcohol use: Yes    Alcohol/week: 12.0 standard drinks of alcohol    Types: 12 Standard drinks or equivalent per week    Comment: 1-2 beers q day   Drug use: No   Sexual activity: Yes    Partners: Female  Other Topics Concern   Not on file  Social History Narrative   Not on file   Social  Drivers of Health   Financial Resource Strain: Medium Risk (12/26/2023)   Overall Financial Resource Strain (CARDIA)    Difficulty of Paying Living Expenses: Somewhat hard  Food Insecurity: No Food Insecurity (12/26/2023)   Hunger Vital Sign    Worried About Running Out of Food in the Last Year: Never true    Ran Out of Food in the Last Year: Never true  Transportation Needs: No Transportation Needs (12/26/2023)   PRAPARE - Administrator, Civil Service (Medical): No    Lack of Transportation (Non-Medical): No  Physical Activity: Insufficiently Active (12/26/2023)   Exercise  Vital Sign    Days of Exercise per Week: 1 day    Minutes of Exercise per Session: 50 min  Stress: No Stress Concern Present (12/26/2023)   [employer] of Occupational Health - Occupational Stress Questionnaire    Feeling of Stress: Only a little  Social Connections: Moderately Isolated (12/26/2023)   Social Connection and Isolation Panel    Frequency of Communication with Friends and Family: More than three times a week    Frequency of Social Gatherings with Friends and Family: Twice a week    Attends Religious Services: 1 to 4 times per year    Active Member of [employer] or Organizations: No    Attends Engineer, structural: Not on file    Marital Status: Divorced    Family History  Problem Relation Age of Onset   Diabetes Mother    Cancer Mother    Hypertension Neg Hx    Heart disease Neg Hx     Health Maintenance  Topic Date Due   Zoster Vaccines- Shingrix (1 of 2) Never done   OPHTHALMOLOGY EXAM  03/18/2023   Diabetic kidney evaluation - Urine ACR  10/24/2023   Pneumococcal Vaccine: 50+ Years (1 of 2 - PCV) 06/30/2024 (Originally 03/03/1975)   Colonoscopy  06/30/2024 (Originally 03/04/2003)   INFLUENZA VACCINE  08/11/2024 (Originally 12/13/2023)   HEMOGLOBIN A1C  07/01/2024   Diabetic kidney evaluation - eGFR measurement  08/22/2024   FOOT EXAM  12/29/2024   DTaP/Tdap/Td (2 - Td or Tdap) 09/12/2027   Hepatitis C Screening  Completed   HIV Screening  Completed   Hepatitis B Vaccines 19-59 Average Risk  Aged Out   HPV VACCINES  Aged Out   Meningococcal B Vaccine  Aged Out   COVID-19 Vaccine  Discontinued     ----------------------------------------------------------------------------------------------------------------------------------------------------------------------------------------------------------------- Physical Exam BP 106/72   Pulse (!) 109   Ht 5' 7 (1.702 m)   Wt 225 lb (102.1 kg)   SpO2 93%   BMI 35.24 kg/m   Physical  Exam Constitutional:      Appearance: Normal appearance.  Eyes:     General: No scleral icterus. Cardiovascular:     Rate and Rhythm: Normal rate and regular rhythm.  Pulmonary:     Effort: Pulmonary effort is normal.     Breath sounds: Normal breath sounds.  Neurological:     General: No focal deficit present.     Mental Status: He is alert.  Psychiatric:        Mood and Affect: Mood normal.        Behavior: Behavior normal.     ------------------------------------------------------------------------------------------------------------------------------------------------------------------------------------------------------------------- Assessment and Plan  Type 2 diabetes mellitus with obesity (HCC) Blood sugars are well-controlled.  Recommend continuation of current medications.  Continue with dietary change.  Hyperlipidemia associated with type 2 diabetes mellitus (HCC) Recommend continuation of atorvastatin .  Essential hypertension Blood pressure is well-controlled  at this time.  He will continue valsartan  and chlorthalidone .   No orders of the defined types were placed in this encounter.   Return in about 6 months (around 07/01/2024) for Hypertension, Type 2 Diabetes.

## 2024-01-13 ENCOUNTER — Encounter: Payer: Self-pay | Admitting: Emergency Medicine

## 2024-01-13 ENCOUNTER — Ambulatory Visit
Admission: EM | Admit: 2024-01-13 | Discharge: 2024-01-13 | Disposition: A | Discharge status: Home / Self Care | Attending: Family Medicine | Admitting: Family Medicine

## 2024-01-13 DIAGNOSIS — L03113 Cellulitis of right upper limb: Secondary | ICD-10-CM

## 2024-01-13 MED ORDER — DOXYCYCLINE HYCLATE 100 MG PO CAPS: 100.0000 mg | ORAL_CAPSULE | Freq: Two times a day (BID) | ORAL | 0 refills | Status: AC

## 2024-01-13 NOTE — Discharge Instructions (Addendum)
 Advised patient to take medication as directed with food to completion.  Encouraged increase daily water intake to 64 ounces per day while taking this medication.  Advised if symptoms worsen and/or unresolved please follow-up with your PCP, [facility] dermatology, or go to nearest ED for further evaluation.

## 2024-01-13 NOTE — ED Provider Notes (Signed)
 [facility]    CSN: [id number] Arrival date & time: 01/13/24  1313      History   Chief Complaint Chief Complaint  Patient presents with   Rash    HPI Brandon Conrad is a 68 y.o. male.   HPI 68 year old male presents with a rash right hand for 2 weeks.  Patient reports redness and drainage from the site.  PMH significant for HTN, hypertriglyceridemia, and T2DM with hyperglycemia.  Past Medical History:  Diagnosis Date   Abnormal weight gain 05/17/2021   Acute hearing loss of right ear 01/23/2023   Angina pectoris (HCC) 07/01/2023   Anxiety    Essential hypertension 02/18/2015   Patient was on lisinopril  for several months, blood count and rash, switch to beta blocker for treatment of hypertension/anxiety 04/2015     H/O cardiovascular stress test 09/03/2019   08/2019 at [facility] - hospitalized for ACS r/o:   NM stress test- IMPRESSION:  1. No reversible ischemia or infarction.  2. Normal left ventricular wall motion.  3. Left ventricular ejection fraction 73%  4. Non invasive risk stratification*: Low          History of episode of anxiety 02/18/2015   Patient treated with Zoloft , fluoxetine , Lexapro  - discontinue Lexapro  after rash broke out, patient opts to DC antidepressant/antianxiety therapy with SSRI or other, I prescribed beta blocker 04/2015     Hyperlipidemia associated with type 2 diabetes mellitus (HCC) 05/17/2021   Hypertension    Hypertriglyceridemia 08/04/2021   Hypomagnesemia 08/04/2021   Hyponatremia 08/04/2021   Impacted cerumen of right ear 01/23/2023   LFT elevation 08/04/2021   Low testosterone  03/27/2022   Obesity (BMI 30-39.9) 08/05/2021   Otitis externa 10/24/2022   Otitis media 06/08/2022   Type 2 diabetes mellitus with obesity (HCC) 05/17/2021   Umbilical hernia without obstruction and without gangrene 10/24/2022   Uncontrolled type 2 diabetes mellitus with hyperglycemia, without long-term current use of insulin  (HCC) 08/04/2021     Patient Active Problem List   Diagnosis Date Noted   Aortic atherosclerosis (HCC) 08/23/2023   Chest pain of uncertain etiology 08/23/2023   Morbid obesity (HCC) 08/23/2023   Anxiety    Hypertension    Angina pectoris (HCC) 07/01/2023   Umbilical hernia without obstruction and without gangrene 10/24/2022   Otitis media 06/08/2022   Low testosterone  03/27/2022   Obesity (BMI 30-39.9) 08/05/2021   Uncontrolled type 2 diabetes mellitus with hyperglycemia, without long-term current use of insulin  (HCC) 08/04/2021   Hyponatremia 08/04/2021   Hypomagnesemia 08/04/2021   LFT elevation 08/04/2021   Hypertriglyceridemia 08/04/2021   Type 2 diabetes mellitus with obesity (HCC) 05/17/2021   Hyperlipidemia associated with type 2 diabetes mellitus (HCC) 05/17/2021   Abnormal weight gain 05/17/2021   H/O cardiovascular stress test 09/03/2019   Essential hypertension 02/18/2015   History of episode of anxiety 02/18/2015    Past Surgical History:  Procedure Laterality Date   APPENDECTOMY  2011       Home Medications    Prior to Admission medications   Medication Sig Start Date End Date Taking? Authorizing Provider  ACCU-CHEK GUIDE TEST test strip USE TO TEST BOLOOD GLOCSOE LEVELS UP TO 4 TIMES A DAY 05/16/23  Yes Alvia Bring, DO  aspirin  81 MG EC tablet Take 1 tablet (81 mg total) by mouth daily. 08/06/21  Yes Tobie Yetta HERO, MD  atorvastatin  (LIPITOR) 40 MG tablet Take 1 tablet (40 mg total) by mouth daily. 07/01/23  Yes Alvia Bring, DO  Blood Glucose  Monitoring Suppl (ACCU-CHEK GUIDE ME) w/Device KIT DISPENSE BASED ON PATIENT AND INSURANCE PREFERENCE. USE UP TO FOUR TIMES DAILY AS DIRECTED. 02/18/23  Yes Alvia Bring, DO  chlorthalidone  (HYGROTON ) 25 MG tablet TAKE 1 TABLET (25 MG TOTAL) BY MOUTH DAILY. 08/09/23  Yes Alvia Bring, DO  clotrimazole  (LOTRIMIN ) 1 % external solution 4 drops in the affected ear 4 times a day for 10 days 01/23/23  Yes Breeback, Jade L, PA-C   cyclobenzaprine  (FLEXERIL ) 10 MG tablet TAKE 1 TABLET BY MOUTH THREE TIMES A DAY AS NEEDED FOR MUSCLE SPASMS 12/09/23  Yes Alvia Bring, DO  doxycycline  (VIBRAMYCIN ) 100 MG capsule Take 1 capsule (100 mg total) by mouth 2 (two) times daily for 10 days. 01/13/24 01/23/24 Yes Teddy Sharper, FNP  fenofibrate  160 MG tablet TAKE 1 TABLET BY MOUTH EVERY DAY 09/25/23  Yes Alvia Bring, DO  folic acid  (FOLVITE ) 1 MG tablet TAKE 1 TABLET BY MOUTH EVERY DAY 10/09/23  Yes Alvia Bring, DO  insulin  aspart (NOVOLOG  FLEXPEN) 100 UNIT/ML FlexPen IF BLOOD GLUCOSE IS 121-150: TAKE 2 UNITS, BG 151 - 200: TAKE 3 UNITS, BG 201 - 250:TAKE 5 UNITS, BG 251 - 300:TAKE 8 UNITS, BG 301 - 350: TAKE 11 UNITS, BG MORE THAN 351: TAKE 15 UNITS 10/17/22  Yes Alvia Bring, DO  Insulin  Pen Needle 32G X 4 MM MISC 1 Act by Does not apply route 5 (five) times daily. 08/06/21  Yes Tobie Yetta HERO, MD  metFORMIN  (GLUCOPHAGE -XR) 500 MG 24 hr tablet TAKE 2 TABLETS BY MOUTH EVERY DAY WITH BREAKFAST 09/17/23  Yes Alvia Bring, DO  olmesartan  (BENICAR ) 40 MG tablet TAKE 1 TABLET BY MOUTH EVERY DAY 11/13/23  Yes Alvia Bring, DO  omega-3 acid ethyl esters (LOVAZA ) 1 g capsule Take 1 capsule (1 g total) by mouth daily. 07/01/23  Yes Alvia Bring, DO  omeprazole  (PRILOSEC) 40 MG capsule TAKE 1 CAPSULE BY MOUTH EVERY DAY 11/13/23  Yes Alvia Bring, DO  Semaglutide ,0.25 or 0.5MG /DOS, (OZEMPIC , 0.25 OR 0.5 MG/DOSE,) 2 MG/3ML SOPN INJECT 0.25MG  WEEKLY X4 WEEKS THEN INCREASE TO 0.5MG  10/21/23  Yes Alvia Bring, DO  Thiamine  HCl (B-1) 100 MG TABS TAKE 1 TABLET BY MOUTH EVERY DAY 10/14/23  Yes Alvia Bring, DO  Vitamin D , Ergocalciferol , (DRISDOL ) 1.25 MG (50000 UNIT) CAPS capsule TAKE 1 CAPSULE (50,000 UNITS TOTAL) BY MOUTH EVERY 7 (SEVEN) DAYS 05/27/23  Yes Alvia Bring, DO    Family History Family History  Problem Relation Age of Onset   Diabetes Mother    Cancer Mother    Hypertension Neg Hx    Heart disease Neg Hx     Social  History Social History   Tobacco Use   Smoking status: Former    Current packs/day: 0.00    Types: Cigarettes    Quit date: 02/16/2015    Years since quitting: 8.9   Smokeless tobacco: Never   Tobacco comments:    currently smoking 3 - 4 cigs/day  Vaping Use   Vaping status: Never Used  Substance Use Topics   Alcohol use: Yes    Alcohol/week: 12.0 standard drinks of alcohol    Types: 12 Standard drinks or equivalent per week    Comment: 1-2 beers q day   Drug use: No     Allergies   Bee venom and Cephalosporins   Review of Systems Review of Systems  Skin:  Positive for rash.     Physical Exam Triage Vital Signs ED Triage Vitals  Encounter Vitals Group  BP      Girls Systolic BP Percentile      Girls Diastolic BP Percentile      Boys Systolic BP Percentile      Boys Diastolic BP Percentile      Pulse      Resp      Temp      Temp src      SpO2      Weight      Height      Head Circumference      Peak Flow      Pain Score      Pain Loc      Pain Education      Exclude from Growth Chart    No data found.  Updated Vital Signs BP 130/78 (BP Location: Right Arm)   Pulse (!) 113   Temp 99 F (37.2 C) (Oral)   Resp 18   Ht 5' 7 (1.702 m)   Wt 225 lb (102.1 kg)   SpO2 93%   BMI 35.24 kg/m   Visual Acuity Right Eye Distance:   Left Eye Distance:   Bilateral Distance:    Right Eye Near:   Left Eye Near:    Bilateral Near:     Physical Exam Vitals and nursing note reviewed.  Constitutional:      Appearance: Normal appearance. He is obese.  HENT:     Head: Normocephalic and atraumatic.     Mouth/Throat:     Mouth: Mucous membranes are moist.     Pharynx: Oropharynx is clear.  Eyes:     Extraocular Movements: Extraocular movements intact.     Pupils: Pupils are equal, round, and reactive to light.  Cardiovascular:     Rate and Rhythm: Normal rate and regular rhythm.     Pulses: Normal pulses.     Heart sounds: Normal heart sounds.   Pulmonary:     Effort: Pulmonary effort is normal.     Breath sounds: Normal breath sounds. No wheezing, rhonchi or rales.  Musculoskeletal:        General: Normal range of motion.  Skin:    General: Skin is warm and dry.     Comments: Right palm, adjacent to right thumb: Erythematous, macerated please see image below  Neurological:     General: No focal deficit present.     Mental Status: He is alert and oriented to person, place, and time. Mental status is at baseline.  Psychiatric:        Mood and Affect: Mood normal.        Behavior: Behavior normal.        Thought Content: Thought content normal.      UC Treatments / Results  Labs (all labs ordered are listed, but only abnormal results are displayed) Labs Reviewed - No data to display  EKG   Radiology No results found.  Procedures Procedures (including critical care time)  Medications Ordered in UC Medications - No data to display  Initial Impression / Assessment and Plan / UC Course  I have reviewed the triage vital signs and the nursing notes.  Pertinent labs & imaging results that were available during my care of the patient were reviewed by me and considered in my medical decision making (see chart for details).     MDM: 1.  Cellulitis of right hand-Rx'd doxycycline  100 mg capsule: Take 1 capsule twice daily x 10 days. Advised patient to take medication as directed with food to completion.  Encouraged increase daily water intake to 64 ounces per day while taking this medication.  Advised if symptoms worsen and/or unresolved please follow-up with your PCP, [facility] dermatology, or go to nearest ED for further evaluation.  Patient discharged home, hemodynamically stable. Final Clinical Impressions(s) / UC Diagnoses   Final diagnoses:  Cellulitis of right hand     Discharge Instructions      Advised patient to take medication as directed with food to completion.  Encouraged increase daily water intake  to 64 ounces per day while taking this medication.  Advised if symptoms worsen and/or unresolved please follow-up with your PCP, [facility] dermatology, or go to nearest ED for further evaluation.     ED Prescriptions     Medication Sig Dispense Auth. Provider   doxycycline  (VIBRAMYCIN ) 100 MG capsule Take 1 capsule (100 mg total) by mouth 2 (two) times daily for 10 days. 20 capsule Christopher Hink, FNP      PDMP not reviewed this encounter.   Teddy Sharper, FNP 01/13/24 1400

## 2024-01-13 NOTE — ED Triage Notes (Signed)
 Patient c/o rash on right hand x 2 weeks, denies any itchiness or pain from rash.  The area is red and drainage from the site.  Denies any OTC meds applied to area.

## 2024-02-08 ENCOUNTER — Other Ambulatory Visit: Payer: Self-pay | Admitting: Family Medicine

## 2024-02-08 DIAGNOSIS — R252 Cramp and spasm: Secondary | ICD-10-CM

## 2024-02-26 ENCOUNTER — Ambulatory Visit: Payer: Self-pay | Admitting: *Deleted

## 2024-02-26 NOTE — Telephone Encounter (Signed)
 FYI Only or Action Required?: FYI only for provider.  Patient was last seen in primary care on 12/30/2023 by Alvia Bring, DO.  Called Nurse Triage reporting Urticaria.  Symptoms began several days ago.  Interventions attempted: Prescription medications: just finished prednisone  for allergic reaction- hive did not completely go away.  Symptoms are: gradually worsening.  Triage Disposition: See PCP When Office is Open (Within 3 Days)  Patient/caregiver understands and will follow disposition?:  Reason for Disposition  Hives persist > 1 week  Answer Assessment - Initial Assessment Questions 1. APPEARANCE: What does the rash look like?      Red blotchy 2. LOCATION: Where is the rash located?      Both arms 3. NUMBER: How many hives are there?      multiple  5. ONSET: When did the hives begin? (e.g., hours or days ago)      Was seen ED- 9 days ago- was treated- hives almost went away, within past 2-3  days worse 6. ITCHING: Does it itch? If Yes, ask: How bad is the itch?  (e.g., none, mild, moderate, severe)     Yes- moderate 7. RECURRENT PROBLEM: Have you had hives before? If Yes, ask: When was the last time? and What happened that time?      Patient was treated for reaction- returning now that treatment has stopped 8. TRIGGERS: Were you exposed to any new food, plant, cosmetic product or animal just before the hives began?     Imaging oil exposure 9. OTHER SYMPTOMS: Do you have any other symptoms? (e.g., fever, tongue swelling, difficulty breathing, abdomen pain)     Did occur with last exposure- wheezy  Protocols used: Hives-A-AH   Copied from CRM #[id number]. Topic: Clinical - Red Word Triage >> Feb 26, 2024  9:39 AM Donna BRAVO wrote: Red Word that prompted transfer to Nurse Triage: patient has been taking prednisone , benadryl , hydrocortisone  cream, pepcid.  For Hives due to chemical reaction, chemicals from work.  Symptoms: Getting worse over the last  couple of days All over both arms Red blotchy Very itchy

## 2024-02-27 ENCOUNTER — Ambulatory Visit: Admitting: Urgent Care

## 2024-02-27 ENCOUNTER — Encounter: Payer: Self-pay | Admitting: Urgent Care

## 2024-02-27 VITALS — BP 100/61 | HR 103 | Ht 67.0 in | Wt 219.0 lb

## 2024-02-27 DIAGNOSIS — L245 Irritant contact dermatitis due to other chemical products: Secondary | ICD-10-CM | POA: Diagnosis not present

## 2024-02-27 DIAGNOSIS — T7840XD Allergy, unspecified, subsequent encounter: Secondary | ICD-10-CM

## 2024-02-27 MED ORDER — CLOBETASOL PROPIONATE 0.05 % EX CREA: 1.0000 | TOPICAL_CREAM | Freq: Every day | CUTANEOUS | 0 refills | Status: DC

## 2024-02-27 MED ORDER — PREDNISONE 10 MG PO TABS: ORAL_TABLET | ORAL | 0 refills | Status: AC

## 2024-02-27 MED ORDER — HYDROXYZINE PAMOATE 25 MG PO CAPS: 25.0000 mg | ORAL_CAPSULE | Freq: Three times a day (TID) | ORAL | 0 refills | Status: DC | PRN

## 2024-02-27 MED ORDER — METHYLPREDNISOLONE SODIUM SUCC 125 MG IJ SOLR
125.0000 mg | Freq: Once | INTRAMUSCULAR | Status: AC
  Administered 2024-02-27: 125 mg via INTRAVENOUS

## 2024-02-27 NOTE — Patient Instructions (Addendum)
 HAPPY BIRTHDAY!!! :)   Please use the clobetasol cream to your hands once daily in the morning. Do not exceed 14 days use. At night, use the above product.  Start the 12 day steroid taper tomorrow morning with breakfast. Stop the benadryl  and switch to hydroxyzine prescribed today, every 8 hours, until rash and itching has stopped.  If rash recurs after the above treatment, please return for further testing.

## 2024-02-27 NOTE — Progress Notes (Signed)
 Established Patient Office Visit  Subjective:  Patient ID: Brandon Conrad, male    DOB: 1955/09/05  Age: 68 y.o. MRN: [id number]  Chief Complaint  Patient presents with   Hospitalization Follow-up    Hives    HPI  Discussed the use of AI scribe software for clinical note transcription with the patient, who gave verbal consent to proceed.  History of Present Illness   Brandon Conrad is a 68 year old male who presents with hives and an allergic reaction after exposure to imaging oil.  Approximately two months ago, he experienced hives and an allergic reaction after exposure to a different type of imaging oil at a work location in [place], [place] . He was aware of his allergy to this oil. The reaction began with a cut on his hand that became infected and spread despite protective measures such as wearing gloves and long sleeves.  Due to the severity of the hives, which covered his body from head to toe, he visited the emergency department at [facility]. He was prescribed a five-day course of prednisone , which he has used in the past for similar reactions like poison ivy with good effect. However, the relief was temporary, and symptoms returned after the course ended.  He describes systemic symptoms including wheeziness in his chest and difficulty swallowing, which have mostly subsided but occasionally recur. He also reports blotchiness on his legs and hives on his torso, although these have improved somewhat.  For symptom management, he has been taking two over-the-counter Benadryl  tablets and one Pepcid, though he is unsure of the exact dosages. He has also been using hydrocortisone  ointment on his hands.  His work history includes six years in [place] without exposure to the allergen, and he notes that he is no longer exposed to the imaging oil since returning to [place]. He works in [employer] and was temporarily assigned to a location in [place], [place], where he was exposed to the allergen.  He reports wheeziness in his chest and previous difficulty swallowing.      Patient Active Problem List   Diagnosis Date Noted   Aortic atherosclerosis 08/23/2023   Chest pain of uncertain etiology 08/23/2023   Morbid obesity (HCC) 08/23/2023   Anxiety    Hypertension    Angina pectoris 07/01/2023   Umbilical hernia without obstruction and without gangrene 10/24/2022   Otitis media 06/08/2022   Low testosterone  03/27/2022   Obesity (BMI 30-39.9) 08/05/2021   Uncontrolled type 2 diabetes mellitus with hyperglycemia, without long-term current use of insulin  (HCC) 08/04/2021   Hyponatremia 08/04/2021   Hypomagnesemia 08/04/2021   LFT elevation 08/04/2021   Hypertriglyceridemia 08/04/2021   Type 2 diabetes mellitus with obesity 05/17/2021   Hyperlipidemia associated with type 2 diabetes mellitus (HCC) 05/17/2021   Abnormal weight gain 05/17/2021   H/O cardiovascular stress test 09/03/2019   Essential hypertension 02/18/2015   History of episode of anxiety 02/18/2015   Past Medical History:  Diagnosis Date   Abnormal weight gain 05/17/2021   Acute hearing loss of right ear 01/23/2023   Angina pectoris 07/01/2023   Anxiety    Essential hypertension 02/18/2015   Patient was on lisinopril  for several months, blood count and rash, switch to beta blocker for treatment of hypertension/anxiety 04/2015     H/O cardiovascular stress test 09/03/2019   08/2019 at [facility] - hospitalized for ACS r/o:   NM stress test- IMPRESSION:  1. No reversible ischemia or infarction.  2. Normal left ventricular  wall motion.  3. Left ventricular ejection fraction 73%  4. Non invasive risk stratification*: Low          History of episode of anxiety 02/18/2015   Patient treated with Zoloft , fluoxetine , Lexapro  - discontinue Lexapro  after rash broke out, patient opts to DC antidepressant/antianxiety therapy with SSRI or other, I prescribed beta blocker 04/2015      Hyperlipidemia associated with type 2 diabetes mellitus (HCC) 05/17/2021   Hypertension    Hypertriglyceridemia 08/04/2021   Hypomagnesemia 08/04/2021   Hyponatremia 08/04/2021   Impacted cerumen of right ear 01/23/2023   LFT elevation 08/04/2021   Low testosterone  03/27/2022   Obesity (BMI 30-39.9) 08/05/2021   Otitis externa 10/24/2022   Otitis media 06/08/2022   Type 2 diabetes mellitus with obesity 05/17/2021   Umbilical hernia without obstruction and without gangrene 10/24/2022   Uncontrolled type 2 diabetes mellitus with hyperglycemia, without long-term current use of insulin  (HCC) 08/04/2021   Past Surgical History:  Procedure Laterality Date   APPENDECTOMY  2011   Social History   Tobacco Use   Smoking status: Former    Current packs/day: 0.00    Types: Cigarettes    Quit date: 02/16/2015    Years since quitting: 9.0   Smokeless tobacco: Never   Tobacco comments:    currently smoking 3 - 4 cigs/day  Vaping Use   Vaping status: Never Used  Substance Use Topics   Alcohol use: Yes    Alcohol/week: 12.0 standard drinks of alcohol    Types: 12 Standard drinks or equivalent per week    Comment: 1-2 beers q day   Drug use: No      ROS: as noted in HPI  Objective:     BP 100/61   Pulse (!) 103   Ht 5' 7 (1.702 m)   Wt 219 lb (99.3 kg)   SpO2 96%   BMI 34.30 kg/m  BP Readings from Last 3 Encounters:  02/27/24 100/61  01/13/24 130/78  12/30/23 106/72   Wt Readings from Last 3 Encounters:  02/27/24 219 lb (99.3 kg)  01/13/24 225 lb (102.1 kg)  12/30/23 225 lb (102.1 kg)      Physical Exam Vitals and nursing note reviewed.  Constitutional:      General: He is not in acute distress.    Appearance: Normal appearance. He is not ill-appearing, toxic-appearing or diaphoretic.  HENT:     Head: Normocephalic and atraumatic.     Right Ear: Tympanic membrane, ear canal and external ear normal.     Left Ear: Tympanic membrane, ear canal and external ear  normal.     Nose: Nose normal.     Mouth/Throat:     Mouth: Mucous membranes are moist.     Pharynx: Oropharynx is clear. No oropharyngeal exudate or posterior oropharyngeal erythema.  Eyes:     General: No scleral icterus.       Right eye: No discharge.        Left eye: No discharge.     Extraocular Movements: Extraocular movements intact.     Pupils: Pupils are equal, round, and reactive to light.  Cardiovascular:     Rate and Rhythm: Regular rhythm. Tachycardia present.  Pulmonary:     Effort: Pulmonary effort is normal. No respiratory distress.     Breath sounds: Normal breath sounds. No stridor. No wheezing or rhonchi.  Musculoskeletal:     Cervical back: Normal range of motion and neck supple. No rigidity or tenderness.  Lymphadenopathy:  Cervical: No cervical adenopathy.  Skin:    General: Skin is warm and dry.     Coloration: Skin is not jaundiced.     Findings: Rash (erythema, hives and excoriations noted to B arms; dry cracking hands B) present. No erythema.  Neurological:     General: No focal deficit present.     Mental Status: He is alert and oriented to person, place, and time.  Psychiatric:        Mood and Affect: Mood normal.      No results found for any visits on 02/27/24.  Last CBC Lab Results  Component Value Date   WBC 9.2 10/24/2022   HGB 14.2 10/24/2022   HCT 42.8 10/24/2022   MCV 95.3 10/24/2022   MCH 31.6 10/24/2022   RDW 12.1 10/24/2022   PLT 279 10/24/2022   Last metabolic panel Lab Results  Component Value Date   GLUCOSE 89 08/23/2023   NA 136 08/23/2023   K 4.0 08/23/2023   CL 96 08/23/2023   CO2 24 08/23/2023   BUN 23 08/23/2023   CREATININE 1.55 (H) 08/23/2023   EGFR 49 (L) 08/23/2023   CALCIUM  10.1 08/23/2023   PROT 7.2 10/24/2022   ALBUMIN 3.2 (L) 08/06/2021   BILITOT 0.7 10/24/2022   ALKPHOS 26 (L) 08/06/2021   AST 49 (H) 10/24/2022   ALT 45 10/24/2022   ANIONGAP 6 08/06/2021      The 10-year ASCVD risk score  (Arnett DK, et al., 2019) is: 18.2%  Assessment & Plan:  Irritant contact dermatitis due to other chemical products -     predniSONE ; Take 6 tablets (60 mg total) by mouth daily with breakfast for 2 days, THEN 5 tablets (50 mg total) daily with breakfast for 2 days, THEN 4 tablets (40 mg total) daily with breakfast for 2 days, THEN 3 tablets (30 mg total) daily with breakfast for 2 days, THEN 2 tablets (20 mg total) daily with breakfast for 2 days, THEN 1 tablet (10 mg total) daily with breakfast for 2 days.  Dispense: 42 tablet; Refill: 0 -     hydrOXYzine Pamoate; Take 1 capsule (25 mg total) by mouth every 8 (eight) hours as needed.  Dispense: 30 capsule; Refill: 0 -     Clobetasol Propionate; Apply 1 Application topically daily.  Dispense: 15 g; Refill: 0  Allergic reaction, subsequent encounter -     predniSONE ; Take 6 tablets (60 mg total) by mouth daily with breakfast for 2 days, THEN 5 tablets (50 mg total) daily with breakfast for 2 days, THEN 4 tablets (40 mg total) daily with breakfast for 2 days, THEN 3 tablets (30 mg total) daily with breakfast for 2 days, THEN 2 tablets (20 mg total) daily with breakfast for 2 days, THEN 1 tablet (10 mg total) daily with breakfast for 2 days.  Dispense: 42 tablet; Refill: 0 -     hydrOXYzine Pamoate; Take 1 capsule (25 mg total) by mouth every 8 (eight) hours as needed.  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 Initial prednisone  course insufficient, leading to rebound dermatitis. Symptoms improved with prednisone  but recurred. No longer exposed to allergen. - Administer steroid injection for severe itching and systemic symptoms. - Prescribe 12-day prednisone  taper starting at 60 mg, decreasing by 10 mg every two days. -  Prescribe hydroxyzine for  allergic reaction management. - Continue Pepcid as histamine blocker. - Prescribe topical steroid ointment for hands and arms. - Recommend urea-containing product for nighttime use for dry, cracking skin. - Consider further workup if symptoms recur despite allergen removal.        No follow-ups on file.   Benton LITTIE Gave, PA

## 2024-03-11 ENCOUNTER — Other Ambulatory Visit: Payer: Self-pay | Admitting: Family Medicine

## 2024-03-11 DIAGNOSIS — I10 Essential (primary) hypertension: Secondary | ICD-10-CM

## 2024-03-23 ENCOUNTER — Ambulatory Visit: Admitting: Urgent Care

## 2024-03-23 ENCOUNTER — Ambulatory Visit: Payer: Self-pay | Admitting: *Deleted

## 2024-03-23 VITALS — BP 110/74 | HR 117 | Ht 67.0 in | Wt 221.0 lb

## 2024-03-23 DIAGNOSIS — L508 Other urticaria: Secondary | ICD-10-CM

## 2024-03-23 DIAGNOSIS — L245 Irritant contact dermatitis due to other chemical products: Secondary | ICD-10-CM

## 2024-03-23 MED ORDER — FAMOTIDINE 20 MG PO TABS: 20.0000 mg | ORAL_TABLET | Freq: Two times a day (BID) | ORAL | 0 refills | Status: AC

## 2024-03-23 MED ORDER — CLOBETASOL PROPIONATE 0.05 % EX CREA: 1.0000 | TOPICAL_CREAM | Freq: Every day | CUTANEOUS | 1 refills | Status: DC

## 2024-03-23 MED ORDER — HYDROXYZINE HCL 50 MG PO TABS: 50.0000 mg | ORAL_TABLET | Freq: Three times a day (TID) | ORAL | 3 refills | Status: AC | PRN

## 2024-03-23 NOTE — Telephone Encounter (Signed)
 Appt scheduled today with same provider that seen patient for same sx on 02/27/24. CAL notified of scheduling appt today.       FYI Only or Action Required?: FYI only for provider: appointment scheduled on today .  Patient was last seen in primary care on 02/27/2024 by Lowella Benton CROME, PA.  Called Nurse Triage reporting Urticaria.  Symptoms began several days ago.  Interventions attempted: Prescription medications: hydroxyzine , antihistamine.  Symptoms are: gradually worsening.  Triage Disposition: See Physician Within 24 Hours 4- 24 hours   Patient/caregiver understands and will follow disposition?: Yes        Copied from CRM #[id number]. Topic: Clinical - Red Word Triage >> Mar 23, 2024  1:35 PM Tobias L wrote: Red Word that prompted transfer to Nurse Triage: hives on arms, on palms of hands its almost like the skin is falling off Reason for Disposition  [1] Hives have become worse AND [2] taking oral steroids (e.g., prednisone ) > 24 hours  Answer Assessment - Initial Assessment Questions Last OV 02/27/24 with MICAEL Lowella, PA. After completing steroids and hives gone , patient noted hives returned few days ago and now skin dry flaky and peeling to palms of hands and arms. Feeling like on fire. Denies tongue swelling and no difficulty breathing now. Reports this am did experience hard to swallow water and had to catch breath. Denies chest pain no fever. Reports hands with involuntary tremors since completing prednisone . Reports left kidney area pain. Appt schedule with same provider today that saw him 02/27/24. Recommended if sx worsen go to ED.  CAL notified appt scheduled today due to being seen by same provider on 02/27/24.      1. APPEARANCE: What does the rash look like?      Skin is flaky and peeling small little sheets  2. LOCATION: Where is the rash located?      Palms of hands and both arms  3. NUMBER: How many hives are there?      Skin looks blotchy  4.  SIZE: How big are the hives? (e.g., inches, cm, compare to coins) Do they all look the same or do they vary in shape and size?      Spread out size of dimes and nickels  5. ONSET: When did the hives begin? (e.g., hours or days ago)      Few days ago after finishing prednisone   6. ITCHING: Does it itch? If Yes, ask: How bad is the itch?  (e.g., none, mild, moderate, severe)     Moderate with taking hydroxyzine  7. RECURRENT PROBLEM: Have you had hives before? If Yes, ask: When was the last time? and What happened that time?      Yes 02/27/24 8. TRIGGERS: Were you exposed to any new food, plant, cosmetic product or animal just before the hives began?     Started steroids  9. OTHER SYMPTOMS: Do you have any other symptoms? (e.g., fever, tongue swelling, difficulty breathing, abdomen pain)     Hard time swallowing this am, had to catch breath this am but not feeling sx now.  no chest pain. Denies difficulty breathing , hands noted shaking like involuntary tremors.  10. PREGNANCY: Is there any chance you are pregnant? When was your last menstrual period?       na  Protocols used: Hives-A-AH

## 2024-03-23 NOTE — Progress Notes (Unsigned)
   Established Patient Office Visit  Subjective:  Patient ID: Brandon Conrad, male    DOB: 11/26/1955  Age: 68 y.o. MRN: [id number]  Chief Complaint  Patient presents with   Urticaria    Returned on Friday with his hands peeling    HPI  {History (Optional):23778}  ROS: as noted in HPI  Objective:     BP 110/74   Pulse (!) 117   Ht 5' 7 (1.702 m)   Wt 221 lb (100.2 kg)   SpO2 95%   BMI 34.61 kg/m  {Vitals History (Optional):23777}  Physical Exam   No results found for any visits on 03/23/24.  {Labs (Optional):23779}  The 10-year ASCVD risk score (Arnett DK, et al., 2019) is: 22.7%  Assessment & Plan:  There are no diagnoses linked to this encounter.   No follow-ups on file.   Benton LITTIE Gave, PA

## 2024-03-23 NOTE — Patient Instructions (Addendum)
  Please purchase the product above and use it on your hands nightly. Buy special gloves and wear over hands at night.  Please use the topical clobetasol to the affected areas of hands/ arms daily. Use atarax three times daily. Use famotidine two times daily.   If your symptoms persist despite above treatment, please notify our office and we will place a dermatology referral.

## 2024-03-23 NOTE — Progress Notes (Unsigned)
 Brandon Conrad

## 2024-03-24 ENCOUNTER — Encounter: Payer: Self-pay | Admitting: Urgent Care

## 2024-03-24 LAB — ALLERGY PROFILE, FOOD

## 2024-03-27 ENCOUNTER — Ambulatory Visit: Payer: Self-pay | Admitting: Urgent Care

## 2024-03-27 DIAGNOSIS — L245 Irritant contact dermatitis due to other chemical products: Secondary | ICD-10-CM

## 2024-03-27 DIAGNOSIS — Z889 Allergy status to unspecified drugs, medicaments and biological substances status: Secondary | ICD-10-CM

## 2024-03-27 LAB — ALLERGENS W/COMP RFLX AREA 2

## 2024-03-28 ENCOUNTER — Other Ambulatory Visit: Payer: Self-pay | Admitting: Family Medicine

## 2024-03-28 DIAGNOSIS — E1165 Type 2 diabetes mellitus with hyperglycemia: Secondary | ICD-10-CM

## 2024-03-29 ENCOUNTER — Other Ambulatory Visit: Payer: Self-pay | Admitting: Family Medicine

## 2024-03-29 LAB — ALLERGY PROFILE, FOOD
Allergen Salmon IgE: <0.1 kU/L
Codfish IgE: <0.1 kU/L
F001-IgE Egg White: 0.1 kU/L — AB
F002-IgE Milk: <0.1 kU/L
F004-IgE Wheat: <0.1 kU/L
F010-IgE Sesame Seed: 0.1 kU/L — AB
F017-IgE Hazelnut (Filbert): 2.81 kU/L — AB
F018-IgE Brazil Nut: <0.1 kU/L
F020-IgE Almond: <0.1 kU/L
F202-IgE Cashew Nut: <0.1 kU/L
F256-IgE Walnut: <0.1 kU/L
F416-IgE Tri a 19(w-5 gliadin): <0.1 kU/L
Peanut, IgE: 0.11 kU/L — AB
Scallop IgE: 0.18 kU/L — AB
Shrimp IgE: 0.13 kU/L — AB
Soybean IgE: <0.1 kU/L
Tuna: <0.1 kU/L

## 2024-03-29 LAB — PANEL 604726
Cor A 1 IgE: 3.93 kU/L — AB
Cor A 14 IgE: <0.1 kU/L
Cor A 8 IgE: <0.1 kU/L
Cor A 9 IgE: <0.1 kU/L

## 2024-03-29 LAB — PEANUT COMPONENTS
F352-IgE Ara h 8: 1.01 kU/L — AB
F422-IgE Ara h 1: <0.1 kU/L
F423-IgE Ara h 2: <0.1 kU/L
F424-IgE Ara h 3: <0.1 kU/L
F427-IgE Ara h 9: <0.1 kU/L
F447-IgE Ara h 6: <0.1 kU/L

## 2024-03-29 LAB — TRYPTASE: Tryptase: 8 ug/L (ref 2.2–13.2)

## 2024-03-29 LAB — ALLERGENS W/COMP RFLX AREA 2
Alternaria Alternata IgE: <0.1 kU/L
Aspergillus Fumigatus IgE: <0.1 kU/L
Bermuda Grass IgE: 0.14 kU/L — AB
Cedar, Mountain IgE: <0.1 kU/L
Cladosporium Herbarum IgE: <0.1 kU/L
Cockroach, German IgE: 1.26 kU/L — AB
Common Silver Birch IgE: 5.19 kU/L — AB
Cottonwood IgE: 0.15 kU/L — AB
D Farinae IgE: 63.8 kU/L — AB
D Pteronyssinus IgE: 60.2 kU/L — AB
E001-IgE Cat Dander: 8.29 kU/L — AB
E005-IgE Dog Dander: 2.3 kU/L — AB
Elm, American IgE: <0.1 kU/L
Johnson Grass IgE: 0.25 kU/L — AB
Maple/Box Elder IgE: <0.1 kU/L
Mouse Urine IgE: <0.1 kU/L
Oak, White IgE: 0.34 kU/L — AB
Pecan, Hickory IgE: 0.11 kU/L — AB
Penicillium Chrysogen IgE: <0.1 kU/L
Pigweed, Rough IgE: <0.1 kU/L
Ragweed, Short IgE: 2.3 kU/L — AB
Sheep Sorrel IgE Qn: <0.1 kU/L
Timothy Grass IgE: 0.72 kU/L — AB
White Mulberry IgE: <0.1 kU/L

## 2024-03-29 LAB — SEDIMENTATION RATE: Sed Rate: 17 mm/h (ref 0–30)

## 2024-03-29 LAB — ALPHA-GAL PANEL
Allergen Lamb IgE: <0.1 kU/L
Beef IgE: <0.1 kU/L
IgE (Immunoglobulin E), Serum: 865 [IU]/mL — ABNORMAL HIGH (ref 6–495)
O215-IgE Alpha-Gal: <0.1 kU/L
Pork IgE: <0.1 kU/L

## 2024-03-29 LAB — CMP14+EGFR
ALT: 55 IU/L — ABNORMAL HIGH (ref 0–44)
AST: 50 IU/L — ABNORMAL HIGH (ref 0–40)
Albumin: 4.6 g/dL (ref 3.9–4.9)
Alkaline Phosphatase: 41 IU/L — ABNORMAL LOW (ref 47–123)
BUN/Creatinine Ratio: 14 (ref 10–24)
BUN: 19 mg/dL (ref 8–27)
Bilirubin Total: 0.5 mg/dL (ref 0.0–1.2)
CO2: 22 mmol/L (ref 20–29)
Calcium: 9.8 mg/dL (ref 8.6–10.2)
Chloride: 99 mmol/L (ref 96–106)
Creatinine, Ser: 1.39 mg/dL — ABNORMAL HIGH (ref 0.76–1.27)
Globulin, Total: 2.1 g/dL (ref 1.5–4.5)
Glucose: 171 mg/dL — ABNORMAL HIGH (ref 70–99)
Potassium: 4.5 mmol/L (ref 3.5–5.2)
Sodium: 136 mmol/L (ref 134–144)
Total Protein: 6.7 g/dL (ref 6.0–8.5)
eGFR: 56 mL/min/1.73 — ABNORMAL LOW (ref >59)

## 2024-03-29 LAB — PANEL 606648
E101-IgE Can f 1: <0.1 kU/L
E102-IgE Can f 2: <0.1 kU/L
E221-IgE Can f 3: <0.1 kU/L
E226-IgE Can f 5: <0.1 kU/L

## 2024-03-29 LAB — PANEL 606578
E094-IgE Fel d 1: 10.5 kU/L — AB
E220-IgE Fel d 2: <0.1 kU/L
E228-IgE Fel d 4: <0.1 kU/L

## 2024-03-29 LAB — F449-IGE SES I 1 607742: F449-IgE Ses i 1: <0.1 kU/L

## 2024-03-29 LAB — C-REACTIVE PROTEIN: CRP: 8 mg/L (ref 0–10)

## 2024-03-29 LAB — ALLERGEN COMPONENT COMMENTS

## 2024-03-29 LAB — ANA W/REFLEX IF POSITIVE: Anti Nuclear Antibody (ANA): NEGATIVE

## 2024-03-30 MED ORDER — CLOBETASOL PROPIONATE 0.05 % EX CREA
1.0000 | TOPICAL_CREAM | Freq: Every day | CUTANEOUS | 11 refills | Status: AC
Start: 2024-03-30 — End: ?

## 2024-05-19 ENCOUNTER — Other Ambulatory Visit: Payer: Self-pay | Admitting: Family Medicine

## 2024-05-19 DIAGNOSIS — R252 Cramp and spasm: Secondary | ICD-10-CM

## 2024-06-11 ENCOUNTER — Other Ambulatory Visit: Payer: Self-pay | Admitting: Family Medicine

## 2024-06-18 ENCOUNTER — Other Ambulatory Visit: Payer: Self-pay | Admitting: Family Medicine

## 2024-06-19 ENCOUNTER — Ambulatory Visit: Payer: Self-pay

## 2024-06-19 NOTE — Telephone Encounter (Signed)
 FYI - the patient has an appointment scheduled with the provider on 06/22/24 @ 1130 am.   Per E2C2 Triage RN - Pt. States he noticed Novolog  pens have expired and his blood glucose has been over 250. Today 347.States he has lost weight. No other symptoms.   Please advise, does provider want to increase the insulin  intake prior to the refill being sent to the pharmacy?

## 2024-06-19 NOTE — Telephone Encounter (Signed)
 Called patient to verify location. Patient was moving out of town in August.

## 2024-06-19 NOTE — Telephone Encounter (Signed)
 FYI Only or Action Required?: Action required by provider: request for appointment.  Patient was last seen in primary care on 03/23/2024 by Lowella Benton CROME, PA.  Called Nurse Triage reporting Hyperglycemia.  Symptoms began several days ago.  Interventions attempted: Nothing.  Symptoms are: unchanged.Pt. States he noticed Novolog  pens have expired and his blood glucose has been over 250. Today 347.States he has lost weight.No other symptoms. Appointment made. [facility] [address] Please advise pt. Today.  Triage Disposition: Call PCP Now  Patient/caregiver understands and will follow disposition?: Yes      Summary: high sugar level of 347   Reason for Triage: patient called stated he need his NOVOLOG  FLEXPEN 100 UNIT/ML FlexPen as his sugar this morning was 347. Patient said he does not feel and affects of it at this time. He was called to see where he neede         Reason for Disposition  [1] Symptoms of high blood sugar (e.g., increased thirst, frequent urination, weight loss) AND [2] not able to test blood glucose AND [3] pregnant  Answer Assessment - Initial Assessment Questions 1. BLOOD GLUCOSE: What is your blood glucose level?      347 2. ONSET: When did you check the blood glucose?     today 3. USUAL RANGE: What is your glucose level usually? (e.g., usual fasting morning value, usual evening value)     Has been greater than 250 4. KETONES: Do you check for ketones (urine or blood test strips)? If Yes, ask: What does the test show now?      no 5. TYPE 1 or 2:  Do you know what type of diabetes you have?  (e.g., Type 1, Type 2, Gestational; doesn't know)      Type 1  6. INSULIN : Do you take insulin ? What type of insulin (s) do you use? What is the mode of delivery? (syringe, pen; injection or pump)?      Novolog  7. DIABETES PILLS: Do you take any pills for your diabetes? If Yes, ask: Have you missed taking  any pills recently?     metformin  8. OTHER SYMPTOMS: Do you have any symptoms? (e.g., fever, frequent urination, difficulty breathing, dizziness, weakness, vomiting)     Lost weight 9. PREGNANCY: Is there any chance you are pregnant? When was your last menstrual period?     N/a  Protocols used: Diabetes - High Blood Sugar-A-AH

## 2024-06-22 ENCOUNTER — Ambulatory Visit: Admitting: Family Medicine

## 2024-07-01 ENCOUNTER — Ambulatory Visit: Admitting: Family Medicine

## 8387-01-13 DEATH — deceased
